# Patient Record
Sex: Male | Born: 1985 | Race: Black or African American | Hispanic: No | Marital: Single | Smoking: Current every day smoker
Health system: Southern US, Community
[De-identification: ages and names within clinical notes are randomized; demographics above are authoritative.]

## PROBLEM LIST (undated history)

## (undated) DIAGNOSIS — J45909 Unspecified asthma, uncomplicated: Secondary | ICD-10-CM

---

## 2009-04-26 ENCOUNTER — Emergency Department (HOSPITAL_COMMUNITY): Admission: EM | Admit: 2009-04-26 | Discharge: 2009-04-27 | Payer: Self-pay | Admitting: Emergency Medicine

## 2009-04-30 ENCOUNTER — Emergency Department: Payer: Self-pay | Admitting: Emergency Medicine

## 2010-08-26 IMAGING — CT CT HEAD WITHOUT CONTRAST
2 series · 16 of 30 positions shown, 20 images · non-contrast
Comparison: none

REASON FOR EXAM: headache
COMMENTS:

PROCEDURE:     CT  - CT HEAD WITHOUT CONTRAST  - April 30, 2009  [DATE]
RESULT:     No intra-axial or extra-axial pathologic fluid or blood
collections identified. No mass lesion noted. No hydrocephalus.

[Series 2: without · axial · non-contrast · 0.40mm/px · z∈[+522,+642]mm · 13 of 30 slices shown, 17 images]
[im 3/30  brain]
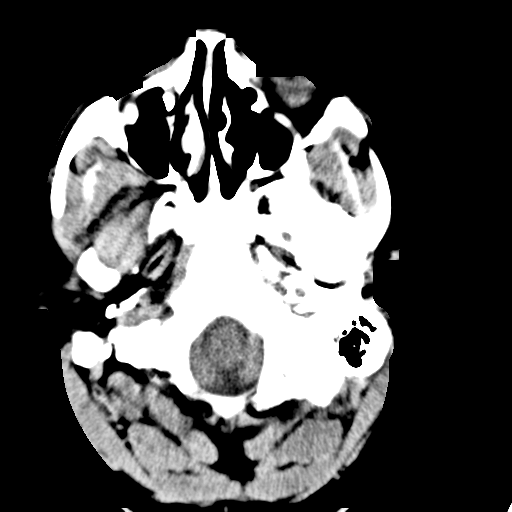
[im 3/30  bone]
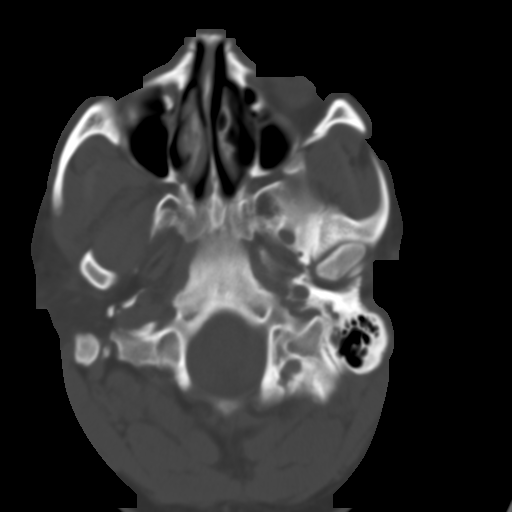
[im 5/30  brain]
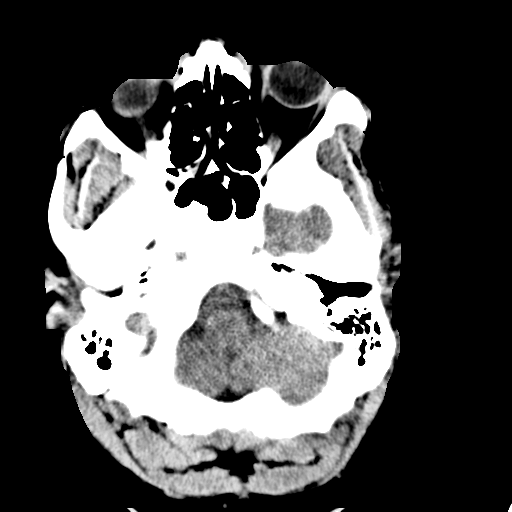
[im 7/30  brain]
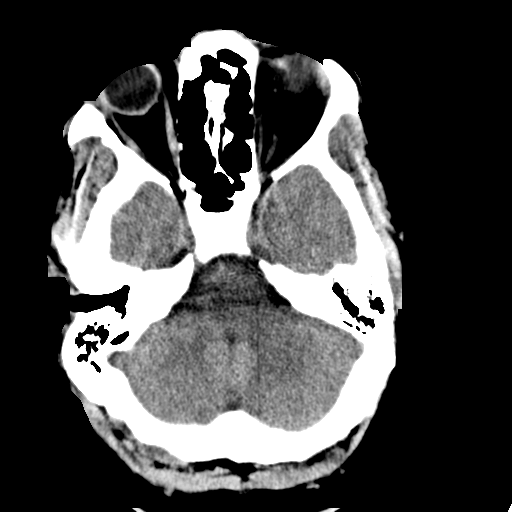
[im 9/30  brain]
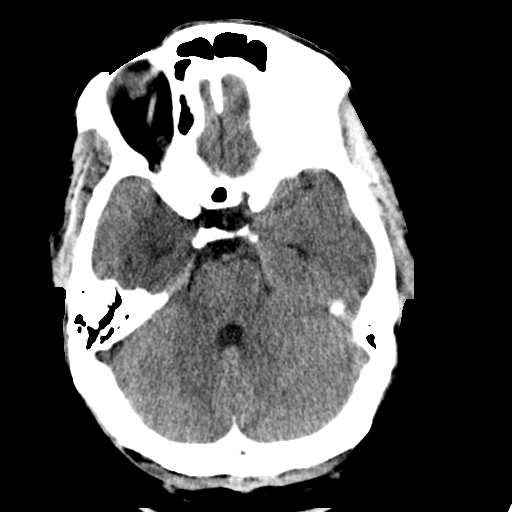
[im 11/30  brain]
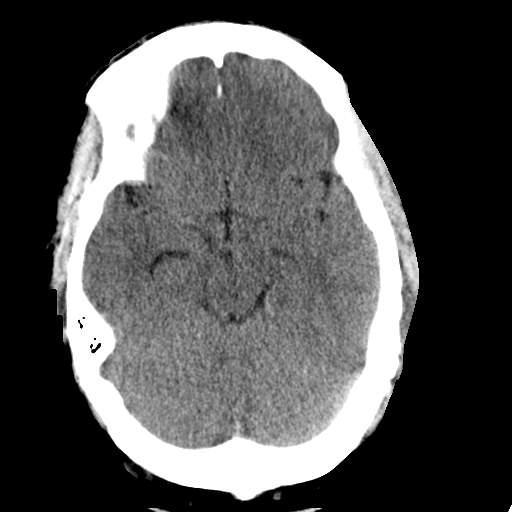
[im 11/30  bone]
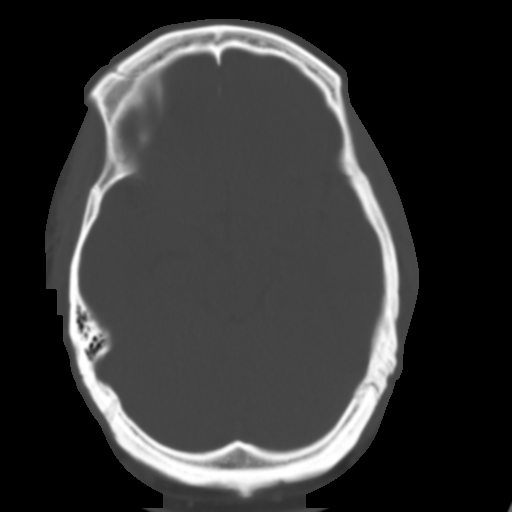
[im 13/30  brain]
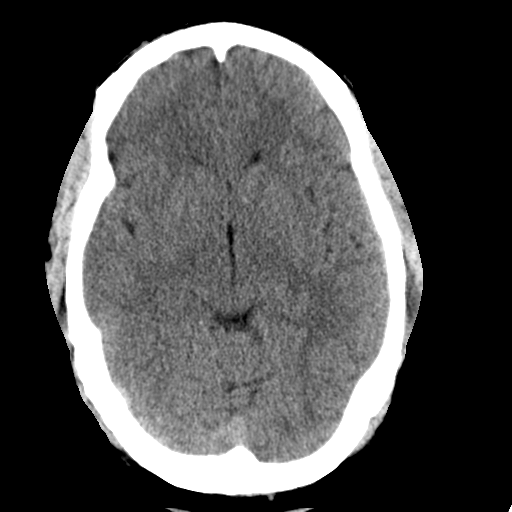
[im 15/30  brain]
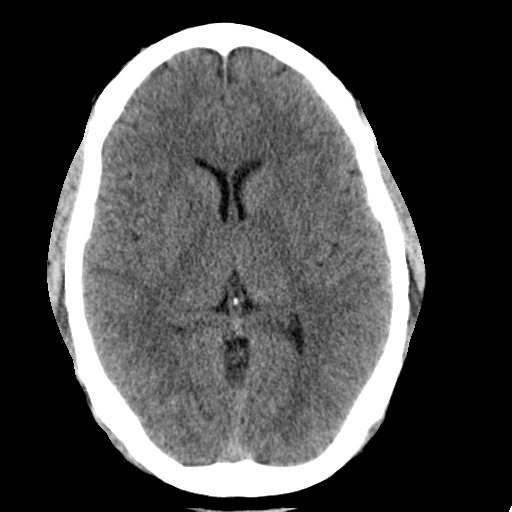
[im 17/30  brain]
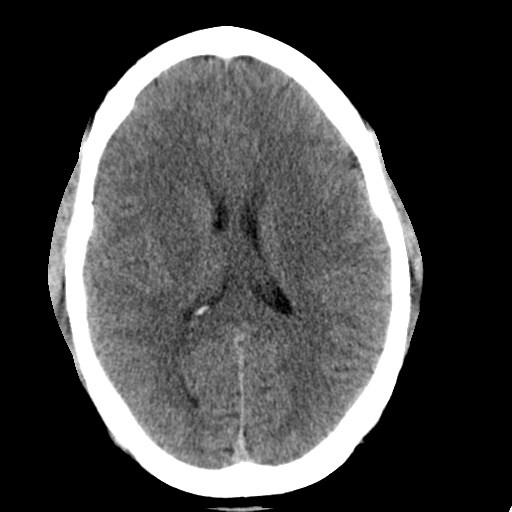
[im 19/30  brain]
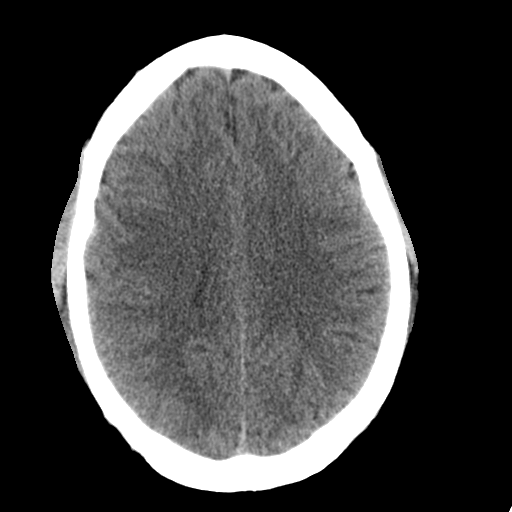
[im 19/30  bone]
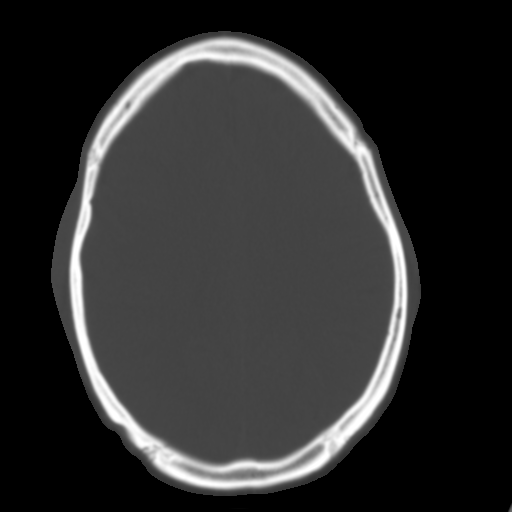
[im 21/30  brain]
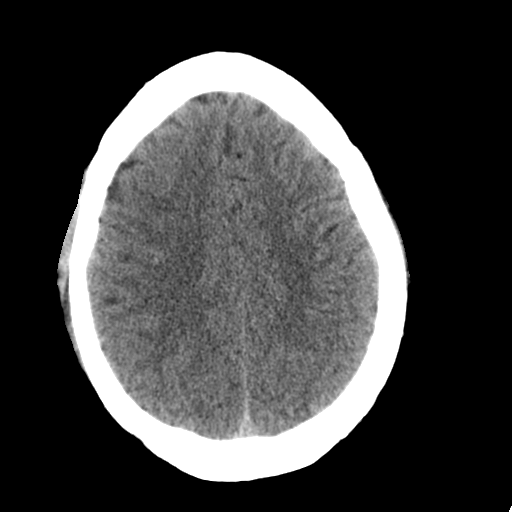
[im 23/30  brain]
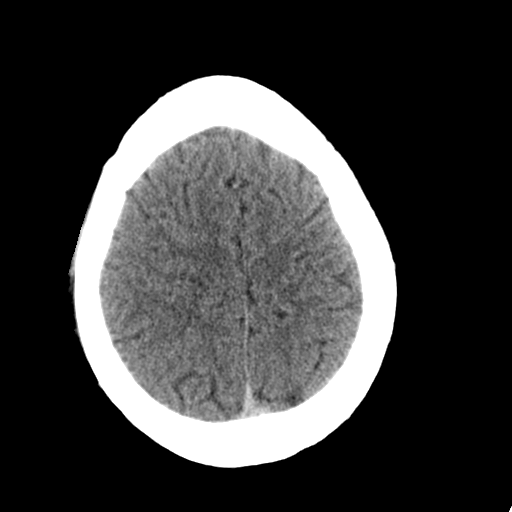
[im 25/30  brain]
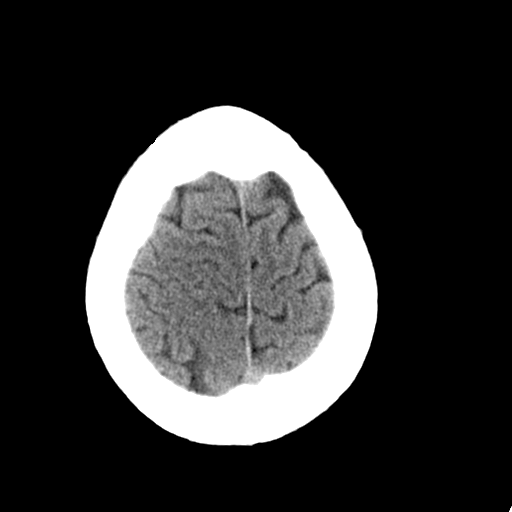
[im 27/30  brain]
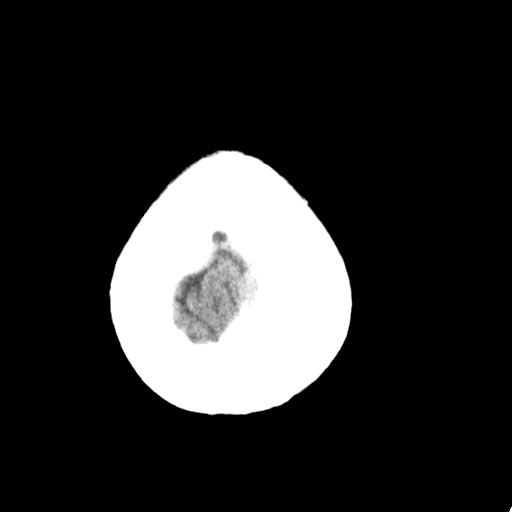
[im 27/30  bone]
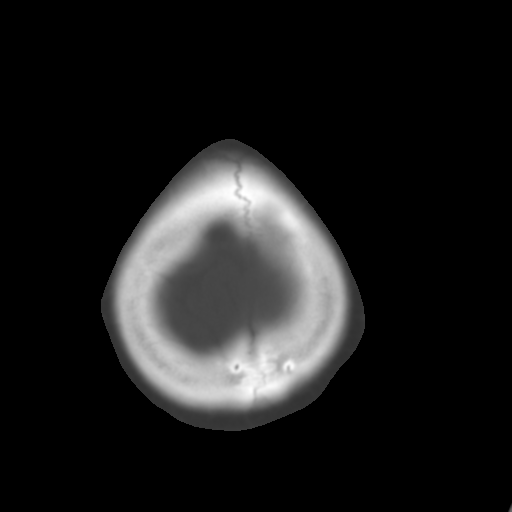

[Series 3: bone · axial · 0.40mm/px · z∈[+522,+562]mm · 3 of 30 slices shown]
[im 3/30  bone]
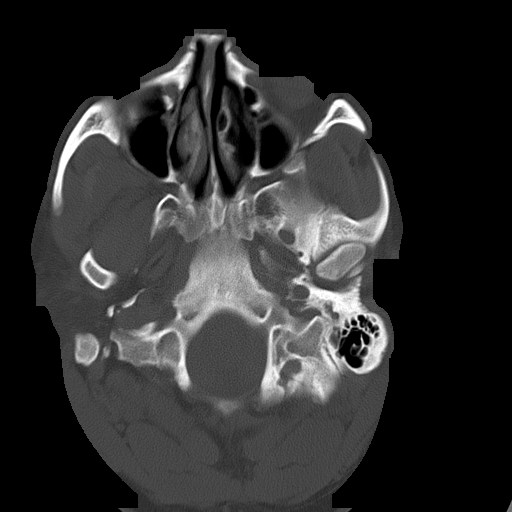
[im 7/30  bone]
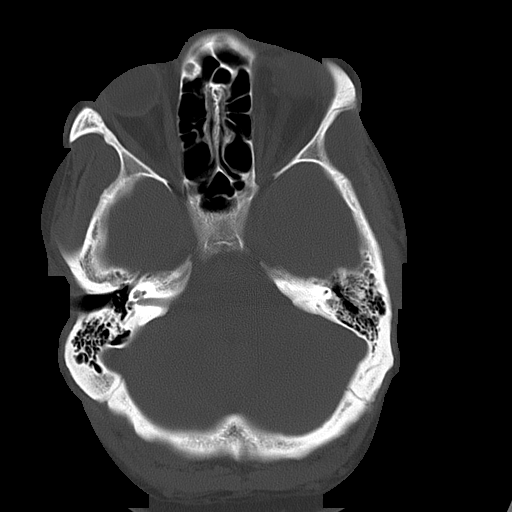
[im 11/30  bone]
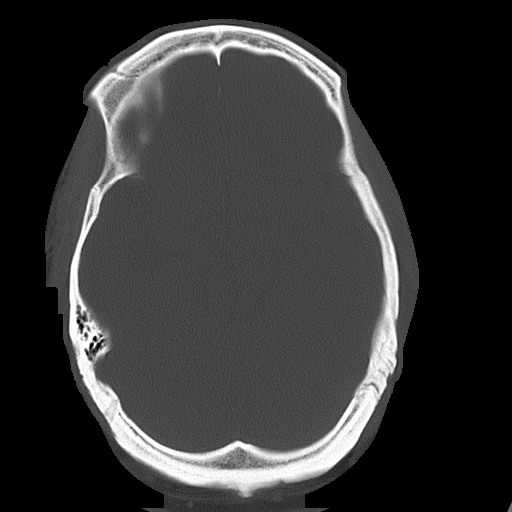

[16 of 30 positions shown; findings below may reference images not displayed]

IMPRESSION: Negative head CT.

## 2011-03-06 LAB — CBC
HCT: 37.8 % — ABNORMAL LOW (ref 39.0–52.0)
Hemoglobin: 13.3 g/dL (ref 13.0–17.0)
MCHC: 35.3 g/dL (ref 30.0–36.0)
MCV: 86.7 fL (ref 78.0–100.0)
Platelets: 289 10*3/uL (ref 150–400)
RDW: 12.8 % (ref 11.5–15.5)

## 2011-03-06 LAB — DIFFERENTIAL
Basophils Absolute: 0 10*3/uL (ref 0.0–0.1)
Eosinophils Absolute: 0.3 10*3/uL (ref 0.0–0.7)
Eosinophils Relative: 3 % (ref 0–5)
Monocytes Absolute: 0.9 10*3/uL (ref 0.1–1.0)

## 2011-03-06 LAB — BASIC METABOLIC PANEL
BUN: 8 mg/dL (ref 6–23)
CO2: 30 mEq/L (ref 19–32)
Chloride: 102 mEq/L (ref 96–112)
Glucose, Bld: 98 mg/dL (ref 70–99)
Potassium: 3.5 mEq/L (ref 3.5–5.1)
Sodium: 138 mEq/L (ref 135–145)

## 2016-01-19 ENCOUNTER — Emergency Department
Admission: EM | Admit: 2016-01-19 | Discharge: 2016-01-19 | Disposition: A | Discharge status: Home / Self Care | Payer: Commercial Managed Care - PPO | Attending: Emergency Medicine | Admitting: Emergency Medicine

## 2016-01-19 DIAGNOSIS — R5383 Other fatigue: Secondary | ICD-10-CM | POA: Insufficient documentation

## 2016-01-19 DIAGNOSIS — J0110 Acute frontal sinusitis, unspecified: Secondary | ICD-10-CM | POA: Diagnosis not present

## 2016-01-19 DIAGNOSIS — F17200 Nicotine dependence, unspecified, uncomplicated: Secondary | ICD-10-CM | POA: Insufficient documentation

## 2016-01-19 DIAGNOSIS — R51 Headache: Secondary | ICD-10-CM | POA: Diagnosis present

## 2016-01-19 LAB — RAPID INFLUENZA A&B ANTIGENS (ARMC ONLY)
INFLUENZA A (ARMC): NOT DETECTED
INFLUENZA B (ARMC): NOT DETECTED

## 2016-01-19 MED ORDER — AMOXICILLIN 875 MG PO TABS: 875.0000 mg | ORAL_TABLET | Freq: Two times a day (BID) | ORAL | Status: DC

## 2016-01-19 MED ORDER — PSEUDOEPH-BROMPHEN-DM 30-2-10 MG/5ML PO SYRP: 10.0000 mL | ORAL_SOLUTION | Freq: Four times a day (QID) | ORAL | Status: DC | PRN

## 2016-01-19 MED ORDER — FLUTICASONE PROPIONATE 50 MCG/ACT NA SUSP: 2.0000 | Freq: Every day | NASAL | Status: DC

## 2016-01-19 NOTE — ED Notes (Signed)
Cough, runny nose, congestion, headache X 2 days. Pt alert and oriented X4, active, cooperative, pt in NAD. RR even and unlabored, color WNL.

## 2016-01-19 NOTE — Discharge Instructions (Signed)

## 2016-01-19 NOTE — ED Provider Notes (Signed)
[facility] Emergency Department Provider Note  ____________________________________________  Time seen: Approximately 6:45 PM  I have reviewed the triage vital signs and the nursing notes.   HISTORY  Chief Complaint Nasal Congestion; URI; and Headache    HPI Tyrone Martin is a 30 y.o. male , NAD, presents to the emergency department with 2 day history of body aches, fever, nasal congestion, runny nose, cough. Had onset of sneezing with some frontal sinus pressure on Saturday. Yesterday had sudden onset of aches, fever, cough.Has been taking Tylenol and Mucinex as of yesterday. Last dose of Tylenol was earlier this morning. Denies any sick contacts. He shortness of breath, chest pain, back pain.   History reviewed. No pertinent past medical history.  There are no active problems to display for this patient.   History reviewed. No pertinent past surgical history.  Current Outpatient Rx  Name  Route  Sig  Dispense  Refill  . amoxicillin (AMOXIL) 875 MG tablet   Oral   Take 1 tablet (875 mg total) by mouth 2 (two) times daily.   20 tablet   0   . brompheniramine-pseudoephedrine-DM 30-2-10 MG/5ML syrup   Oral   Take 10 mLs by mouth 4 (four) times daily as needed.   200 mL   0   . fluticasone (FLONASE) 50 MCG/ACT nasal spray   Each Nare   Place 2 sprays into both nostrils daily.   16 g   0     Allergies Shellfish allergy  No family history on file.  Social History Social History  Substance Use Topics  . Smoking status: Current Every Day Smoker  . Smokeless tobacco: None  . Alcohol Use: Yes     Review of Systems  Constitutional: Positive fever, chills, fatigue Eyes: No visual changes. No discharge ENT: Positive sneezing, frontal sinus pressure, nasal congestion, runny nose. No sore throat. Cardiovascular: No chest pain. Respiratory: Positive cough. No shortness of breath. No wheezing.  Gastrointestinal: No abdominal pain.  No  nausea, vomiting.   Musculoskeletal: Positive for general myalgias. Skin: Negative for rash. Neurological: Positive for frontal sinus headaches, but no focal weakness or numbness. 10-point ROS otherwise negative.  ____________________________________________   PHYSICAL EXAM:  VITAL SIGNS: ED Triage Vitals  Enc Vitals Group     BP 01/19/16 1834 153/98 mmHg     Pulse Rate 01/19/16 1833 96     Resp 01/19/16 1833 18     Temp 01/19/16 1833 99.1 F (37.3 C)     Temp Source 01/19/16 1833 Oral     SpO2 01/19/16 1833 100 %     Weight 01/19/16 1833 250 lb (113.399 kg)     Height 01/19/16 1833  (1.727 m)     Head Cir --      Peak Flow --      Pain Score 01/19/16 1834 5     Pain Loc --      Pain Edu? --      Excl. in GC? --     Constitutional: Alert and oriented. Well appearing and in no acute distress. Eyes: Conjunctivae are normal. PERRL. EOMI without pain.  Head: Atraumatic. ENT:  Frontal sinus tenderness to percussion.       Ears: TMs visualized bilaterally with a dusky appearance, normal light reflex, mild serous effusion, mild bulging. No perforations noted.      Nose: Moderate congestion with profuse clear rhinnorhea.      Mouth/Throat: Mucous membranes are moist. Pharynx without erythema, swelling, exudates. Neck:  No stridor. No cervical spine tenderness to palpation. Supple with full range of motion. Hematological/Lymphatic/Immunilogical: No cervical lymphadenopathy. Cardiovascular: Normal rate, regular rhythm. Normal S1 and S2.  Good peripheral circulation. Respiratory: Normal respiratory effort without tachypnea or retractions. Lungs CTAB. Neurologic:  Normal speech and language. No gross focal neurologic deficits are appreciated.  Skin:  Skin is warm, dry and intact. No rash noted. Psychiatric: Mood and affect are normal. Speech and behavior are normal. Patient exhibits appropriate insight and judgement.   ____________________________________________   LABS (all  labs ordered are listed, but only abnormal results are displayed)  Labs Reviewed  RAPID INFLUENZA A&B ANTIGENS ([facility] ONLY)   ____________________________________________  EKG  None ____________________________________________  RADIOLOGY  none ____________________________________________    PROCEDURES  Procedure(s) performed: None    Medications - No data to display   ____________________________________________   INITIAL IMPRESSION / ASSESSMENT AND PLAN / ED COURSE  Pertinent lab results that were available during my care of the patient were reviewed by me and considered in my medical decision making (see chart for details).  Patient's diagnosis is consistent with acute frontal sinusitis. Patient will be discharged home with prescriptions for amoxicillin, Flonase, Bromfed DM to take as prescribed. Patient is to follow up with [facility] if symptoms persist past this treatment course. Patient is given ED precautions to return to the ED for any worsening or new symptoms.    ____________________________________________  FINAL CLINICAL IMPRESSION(S) / ED DIAGNOSES  Final diagnoses:  Acute frontal sinusitis, recurrence not specified      NEW MEDICATIONS STARTED DURING THIS VISIT:  New Prescriptions   AMOXICILLIN (AMOXIL) 875 MG TABLET    Take 1 tablet (875 mg total) by mouth 2 (two) times daily.   BROMPHENIRAMINE-PSEUDOEPHEDRINE-DM 30-2-10 MG/5ML SYRUP    Take 10 mLs by mouth 4 (four) times daily as needed.   FLUTICASONE (FLONASE) 50 MCG/ACT NASAL SPRAY    Place 2 sprays into both nostrils daily.         Hope Pigeon, PA-C 01/19/16 1935  Sharman Cheek, MD 01/21/16 [id number]

## 2016-02-07 ENCOUNTER — Encounter (HOSPITAL_COMMUNITY): Payer: Self-pay | Admitting: Emergency Medicine

## 2016-02-07 ENCOUNTER — Emergency Department (HOSPITAL_COMMUNITY)
Admission: EM | Admit: 2016-02-07 | Discharge: 2016-02-07 | Disposition: A | Discharge status: Home / Self Care | Payer: Commercial Managed Care - PPO | Attending: Emergency Medicine | Admitting: Emergency Medicine

## 2016-02-07 DIAGNOSIS — J45909 Unspecified asthma, uncomplicated: Secondary | ICD-10-CM | POA: Diagnosis not present

## 2016-02-07 DIAGNOSIS — Y93F2 Activity, caregiving, lifting: Secondary | ICD-10-CM | POA: Insufficient documentation

## 2016-02-07 DIAGNOSIS — S39012A Strain of muscle, fascia and tendon of lower back, initial encounter: Secondary | ICD-10-CM | POA: Diagnosis not present

## 2016-02-07 DIAGNOSIS — Y999 Unspecified external cause status: Secondary | ICD-10-CM | POA: Insufficient documentation

## 2016-02-07 DIAGNOSIS — Y929 Unspecified place or not applicable: Secondary | ICD-10-CM | POA: Insufficient documentation

## 2016-02-07 DIAGNOSIS — F17200 Nicotine dependence, unspecified, uncomplicated: Secondary | ICD-10-CM | POA: Insufficient documentation

## 2016-02-07 DIAGNOSIS — M545 Low back pain: Secondary | ICD-10-CM | POA: Diagnosis present

## 2016-02-07 DIAGNOSIS — Z792 Long term (current) use of antibiotics: Secondary | ICD-10-CM | POA: Diagnosis not present

## 2016-02-07 DIAGNOSIS — W240XXA Contact with lifting devices, not elsewhere classified, initial encounter: Secondary | ICD-10-CM | POA: Diagnosis not present

## 2016-02-07 HISTORY — DX: Unspecified asthma, uncomplicated: J45.909

## 2016-02-07 MED ORDER — DEXAMETHASONE 4 MG PO TABS
4.0000 mg | ORAL_TABLET | Freq: Two times a day (BID) | ORAL | Status: DC
Start: 2016-02-07 — End: 2024-03-19

## 2016-02-07 MED ORDER — DICLOFENAC SODIUM 75 MG PO TBEC
75.0000 mg | DELAYED_RELEASE_TABLET | Freq: Two times a day (BID) | ORAL | Status: DC
Start: 2016-02-07 — End: 2024-03-19

## 2016-02-07 MED ORDER — METHOCARBAMOL 500 MG PO TABS
500.0000 mg | ORAL_TABLET | Freq: Three times a day (TID) | ORAL | Status: DC
Start: 2016-02-07 — End: 2024-03-19

## 2016-02-07 NOTE — Discharge Instructions (Signed)
Please use a heating pad to your lower back, or warm tub soaks for 15-20 minutes daily. Please use Decadron and diclofenac 2 times daily with food until all taken. Use Robaxin 3 times daily. This medication may cause drowsiness, please do not drive, operate machinery, drink alcohol, or pertussis and activities requiring concentration when taking this medication. Please see Dr. Eulah PontMurphy for evaluation if not improving. Muscle Strain A muscle strain is an injury that occurs when a muscle is stretched beyond its normal length. Usually a small number of muscle fibers are torn when this happens. Muscle strain is rated in degrees. First-degree strains have the least amount of muscle fiber tearing and pain. Second-degree and third-degree strains have increasingly more tearing and pain.  Usually, recovery from muscle strain takes 1-2 weeks. Complete healing takes 5-6 weeks.  CAUSES  Muscle strain happens when a sudden, violent force placed on a muscle stretches it too far. This may occur with lifting, sports, or a fall.  RISK FACTORS Muscle strain is especially common in athletes.  SIGNS AND SYMPTOMS At the site of the muscle strain, there may be:  Pain.  Bruising.  Swelling.  Difficulty using the muscle due to pain or lack of normal function. DIAGNOSIS  Your health care provider will perform a physical exam and ask about your medical history. TREATMENT  Often, the best treatment for a muscle strain is resting, icing, and applying cold compresses to the injured area.  HOME CARE INSTRUCTIONS   Use the PRICE method of treatment to promote muscle healing during the first 2-3 days after your injury. The PRICE method involves:  Protecting the muscle from being injured again.  Restricting your activity and resting the injured body part.  Icing your injury. To do this, put ice in a plastic bag. Place a towel between your skin and the bag. Then, apply the ice and leave it on from 15-20 minutes each  hour. After the third day, switch to moist heat packs.  Apply compression to the injured area with a splint or elastic bandage. Be careful not to wrap it too tightly. This may interfere with blood circulation or increase swelling.  Elevate the injured body part above the level of your heart as often as you can.  Only take over-the-counter or prescription medicines for pain, discomfort, or fever as directed by your health care provider.  Warming up prior to exercise helps to prevent future muscle strains. SEEK MEDICAL CARE IF:   You have increasing pain or swelling in the injured area.  You have numbness, tingling, or a significant loss of strength in the injured area. MAKE SURE YOU:   Understand these instructions.  Will watch your condition.  Will get help right away if you are not doing well or get worse.   This information is not intended to replace advice given to you by your health care provider. Make sure you discuss any questions you have with your health care provider.   Document Released: 11/13/2005 Document Revised: 09/03/2013 Document Reviewed: 06/12/2013 Elsevier Interactive Patient Education [employer]2016 [employer] Inc.

## 2016-02-07 NOTE — ED Notes (Signed)
Pt c/o lower back pain x 2 weeks. Pt states pain began after working on building a fence.

## 2016-02-07 NOTE — ED Provider Notes (Signed)
CSN: [id number]     Arrival date & time 02/07/16  1553 History   First MD Initiated Contact with Patient 02/07/16 1639     Chief Complaint  Patient presents with  . Back Pain     (Consider location/radiation/quality/duration/timing/severity/associated sxs/prior Treatment) Patient is a 30 y.o. male presenting with back pain. The history is provided by the patient.  Back Pain Location:  Lumbar spine Quality:  Aching Radiates to:  Does not radiate Pain severity:  Moderate Pain is:  Same all the time Onset quality:  Gradual Duration:  2 weeks Timing:  Intermittent Progression:  Worsening Chronicity:  New Context: lifting heavy objects   Relieved by:  Nothing Worsened by:  Lying down Ineffective treatments:  OTC medications Associated symptoms: no bladder incontinence, no bowel incontinence and no perianal numbness   Risk factors: no recent surgery     Past Medical History  Diagnosis Date  . Asthma    History reviewed. No pertinent past surgical history. No family history on file. Social History  Substance Use Topics  . Smoking status: Current Every Day Smoker  . Smokeless tobacco: None  . Alcohol Use: Yes    Review of Systems  Gastrointestinal: Negative for bowel incontinence.  Genitourinary: Negative for bladder incontinence.  Musculoskeletal: Positive for back pain.  All other systems reviewed and are negative.     Allergies  Shellfish allergy  Home Medications   Prior to Admission medications   Medication Sig Start Date End Date Taking? Authorizing Provider  amoxicillin (AMOXIL) 875 MG tablet Take 1 tablet (875 mg total) by mouth 2 (two) times daily. 01/19/16   Jami L Hagler, PA-C  brompheniramine-pseudoephedrine-DM 30-2-10 MG/5ML syrup Take 10 mLs by mouth 4 (four) times daily as needed. 01/19/16   Jami L Hagler, PA-C  fluticasone (FLONASE) 50 MCG/ACT nasal spray Place 2 sprays into both nostrils daily. 01/19/16   Jami L Hagler, PA-C   BP 123/78 mmHg   Pulse 82  Temp(Src) 98.7 F (37.1 C) (Oral)  Resp 18  Ht  (1.727 m)  Wt 111.131 kg  BMI 37.26 kg/m2  SpO2 100% Physical Exam  Constitutional: He is oriented to person, place, and time. He appears well-developed and well-nourished.  Non-toxic appearance.  HENT:  Head: Normocephalic.  Right Ear: Tympanic membrane and external ear normal.  Left Ear: Tympanic membrane and external ear normal.  Eyes: EOM and lids are normal. Pupils are equal, round, and reactive to light.  Neck: Normal range of motion. Neck supple. Carotid bruit is not present.  Cardiovascular: Normal rate, regular rhythm, normal heart sounds, intact distal pulses and normal pulses.   Pulmonary/Chest: Breath sounds normal. No respiratory distress.  Abdominal: Soft. Bowel sounds are normal. There is no tenderness. There is no guarding.  Musculoskeletal: Normal range of motion.       Lumbar back: He exhibits tenderness and spasm. He exhibits no deformity.       Back:  Lymphadenopathy:       Head (right side): No submandibular adenopathy present.       Head (left side): No submandibular adenopathy present.    He has no cervical adenopathy.  Neurological: He is alert and oriented to person, place, and time. He has normal strength. No cranial nerve deficit or sensory deficit.  Skin: Skin is warm and dry.  Psychiatric: He has a normal mood and affect. His speech is normal.  Nursing note and vitals reviewed.   ED Course  Procedures (including critical care time) Labs Review  Labs Reviewed - No data to display  Imaging Review No results found. I have personally reviewed and evaluated these images and lab results as part of my medical decision-making.   EKG Interpretation None      MDM  There no gross neurologic deficits appreciated on examination. No evidence for caudal equina. Suspect the patient has a muscle strain in the lumbar area. The patient is asked to rest his back, to use a heating pad, or warm tub  soaks. Prescription for Robaxin, diclofenac, and Decadron given to the patient. The patient is referred to Dr. Eulah PontMurphy for additional evaluation if not improving. Patient is in agreement with this discharge plan.    Final diagnoses:  Lumbar strain, initial encounter    *I have reviewed nursing notes, vital signs, and all appropriate lab and imaging results for this patient.Ivery Quale**    Nathanal Hermiz, PA-C 02/07/16 1658  Samuel JesterKathleen McManus, DO 02/11/16 1737

## 2016-02-07 NOTE — ED Notes (Signed)
Pt verbalized understanding of no driving and to use caution within 4 hours of taking pain meds due to meds cause drowsiness 

## 2018-04-22 ENCOUNTER — Emergency Department (HOSPITAL_COMMUNITY)
Admission: EM | Admit: 2018-04-22 | Discharge: 2018-04-22 | Disposition: A | Discharge status: Home / Self Care | Payer: Self-pay | Attending: Emergency Medicine | Admitting: Emergency Medicine

## 2018-04-22 ENCOUNTER — Other Ambulatory Visit: Payer: Self-pay

## 2018-04-22 ENCOUNTER — Encounter (HOSPITAL_COMMUNITY): Payer: Self-pay | Admitting: *Deleted

## 2018-04-22 ENCOUNTER — Emergency Department (HOSPITAL_COMMUNITY): Payer: Self-pay

## 2018-04-22 DIAGNOSIS — J45909 Unspecified asthma, uncomplicated: Secondary | ICD-10-CM | POA: Insufficient documentation

## 2018-04-22 DIAGNOSIS — R0789 Other chest pain: Secondary | ICD-10-CM | POA: Insufficient documentation

## 2018-04-22 DIAGNOSIS — Z79899 Other long term (current) drug therapy: Secondary | ICD-10-CM | POA: Insufficient documentation

## 2018-04-22 LAB — COMPREHENSIVE METABOLIC PANEL
ALBUMIN: 4 g/dL (ref 3.5–5.0)
ALT: 21 U/L (ref 17–63)
ANION GAP: 8 (ref 5–15)
AST: 19 U/L (ref 15–41)
Alkaline Phosphatase: 47 U/L (ref 38–126)
BILIRUBIN TOTAL: 0.5 mg/dL (ref 0.3–1.2)
BUN: 10 mg/dL (ref 6–20)
CO2: 27 mmol/L (ref 22–32)
Calcium: 9.3 mg/dL (ref 8.9–10.3)
Chloride: 103 mmol/L (ref 101–111)
Creatinine, Ser: 0.85 mg/dL (ref 0.61–1.24)
GFR calc Af Amer: >60 mL/min (ref >60)
Glucose, Bld: 103 mg/dL — ABNORMAL HIGH (ref 65–99)
POTASSIUM: 3.8 mmol/L (ref 3.5–5.1)
Sodium: 138 mmol/L (ref 135–145)
TOTAL PROTEIN: 7.4 g/dL (ref 6.5–8.1)

## 2018-04-22 LAB — I-STAT TROPONIN, ED: Troponin i, poc: 0 ng/mL (ref 0.00–0.08)

## 2018-04-22 LAB — CBC
HCT: 40.4 % (ref 39.0–52.0)
Hemoglobin: 13.6 g/dL (ref 13.0–17.0)
MCH: 29.6 pg (ref 26.0–34.0)
MCHC: 33.7 g/dL (ref 30.0–36.0)
MCV: 87.8 fL (ref 78.0–100.0)
PLATELETS: 253 10*3/uL (ref 150–400)
RBC: 4.6 MIL/uL (ref 4.22–5.81)
RDW: 12.8 % (ref 11.5–15.5)
WBC: 8.5 10*3/uL (ref 4.0–10.5)

## 2018-04-22 LAB — D-DIMER, QUANTITATIVE: D-Dimer, Quant: <0.27 ug/mL-FEU (ref 0.00–0.50)

## 2018-04-22 LAB — LIPASE, BLOOD: Lipase: 27 U/L (ref 11–51)

## 2018-04-22 LAB — CK: CK TOTAL: 284 U/L (ref 49–397)

## 2018-04-22 MED ORDER — SODIUM CHLORIDE 0.9 % IV BOLUS
500.0000 mL | Freq: Once | INTRAVENOUS | Status: AC
  Administered 2018-04-22: 500 mL via INTRAVENOUS

## 2018-04-22 MED ORDER — SODIUM CHLORIDE 0.9 % IV BOLUS
1000.0000 mL | Freq: Once | INTRAVENOUS | Status: AC
  Administered 2018-04-22: 1000 mL via INTRAVENOUS

## 2018-04-22 MED ORDER — KETOROLAC TROMETHAMINE 30 MG/ML IJ SOLN
30.0000 mg | Freq: Once | INTRAMUSCULAR | Status: AC
  Administered 2018-04-22: 30 mg via INTRAVENOUS
  Filled 2018-04-22: qty 1

## 2018-04-22 NOTE — ED Triage Notes (Signed)
Pt c/o central chest pain that radiates around to his back that started x one day ago; pt states he was driving when the pain occurred; pt states the pain is worse when he takes a deep breath or eats; pt denies and n/v

## 2018-04-22 NOTE — ED Provider Notes (Signed)
[facility] EMERGENCY DEPARTMENT Provider Note   CSN: [id number] Arrival date & time: 04/22/18  6213  Time seen 04:10 AM   History   Chief Complaint Chief Complaint  Patient presents with  . Chest Pain    HPI Tyrone Martin is a 32 y.o. male.  HPI patient reports Saturday, May 25 he was a passenger in a car and he started getting lower central chest achiness that has been there constantly.  He states the pain radiates into his back.  He feels some shortness of breath but he denies cough, sore throat, rhinorrhea, or sneezing.  He states 10 minutes before I saw him he started getting nausea without vomiting.  He denies any burning sensation or burning fluid in his throat.  He states that moving his right arm, taking a big deep breath, or swallowing makes the pain worse.  He denies feeling like the food is getting stuck.  He states nothing makes it feel better.  He states he works out in the [employer] and it is been very hot for the last few days in the 90s.  He reports sweating a lot.  He states he has been drinking some sports drinks the last couple days while working outside.  He denies any family history of coronary artery disease.  PCP Patient, No Pcp Per   Past Medical History:  Diagnosis Date  . Asthma     There are no active problems to display for this patient.   History reviewed. No pertinent surgical history.      Home Medications    Prior to Admission medications   Medication Sig Start Date End Date Taking? Authorizing Provider  amoxicillin (AMOXIL) 875 MG tablet Take 1 tablet (875 mg total) by mouth 2 (two) times daily. 01/19/16   Hagler, Jami L, PA-C  brompheniramine-pseudoephedrine-DM 30-2-10 MG/5ML syrup Take 10 mLs by mouth 4 (four) times daily as needed. 01/19/16   Hagler, Jami L, PA-C  dexamethasone (DECADRON) 4 MG tablet Take 1 tablet (4 mg total) by mouth 2 (two) times daily with a meal. 02/07/16   Ivery Quale, PA-C  diclofenac (VOLTAREN) 75  MG EC tablet Take 1 tablet (75 mg total) by mouth 2 (two) times daily. 02/07/16   Ivery Quale, PA-C  fluticasone (FLONASE) 50 MCG/ACT nasal spray Place 2 sprays into both nostrils daily. 01/19/16   Hagler, Jami L, PA-C  methocarbamol (ROBAXIN) 500 MG tablet Take 1 tablet (500 mg total) by mouth 3 (three) times daily. 02/07/16   Ivery Quale, PA-C    Family History History reviewed. No pertinent family history.  Social History Social History   Tobacco Use  . Smoking status: Current Every Day Smoker    Packs/day: 0.10    Types: Cigarettes  . Smokeless tobacco: Never Used  Substance Use Topics  . Alcohol use: Yes    Comment: occasionally  . Drug use: No  employed   Allergies   Shellfish allergy   Review of Systems Review of Systems  All other systems reviewed and are negative.    Physical Exam Updated Vital Signs BP (!) 152/89 (BP Location: Right Arm) Comment: Simultaneous filing. User may not have seen previous data.  Pulse 88   Temp 98.1 F (36.7 C) (Oral)   Resp (!) 21   Ht  (1.702 m)   Wt 115.7 kg (255 lb)   SpO2 99%   BMI 39.94 kg/m   Physical Exam  Constitutional: He is oriented to person, place, and time.  He appears well-developed and well-nourished.  Non-toxic appearance. He does not appear ill. No distress.  HENT:  Head: Normocephalic and atraumatic.  Right Ear: External ear normal.  Left Ear: External ear normal.  Nose: Nose normal. No mucosal edema or rhinorrhea.  Mouth/Throat: Oropharynx is clear and moist and mucous membranes are normal. No dental abscesses or uvula swelling.  Eyes: Pupils are equal, round, and reactive to light. Conjunctivae and EOM are normal.  Neck: Normal range of motion and full passive range of motion without pain. Neck supple.  Cardiovascular: Normal rate, regular rhythm and normal heart sounds. Exam reveals no gallop and no friction rub.  No murmur heard. Pulmonary/Chest: Effort normal and breath sounds normal. No  respiratory distress. He has no wheezes. He has no rhonchi. He has no rales. He exhibits no tenderness and no crepitus.  Area of pain noted    Abdominal: Soft. Normal appearance and bowel sounds are normal. He exhibits no distension. There is no tenderness. There is no rebound and no guarding.  Musculoskeletal: Normal range of motion. He exhibits no edema or tenderness.  Moves all extremities well.   Neurological: He is alert and oriented to person, place, and time. He has normal strength. No cranial nerve deficit.  Skin: Skin is warm, dry and intact. No rash noted. No erythema. No pallor.  Psychiatric: He has a normal mood and affect. His speech is normal and behavior is normal. His mood appears not anxious.  Nursing note and vitals reviewed.    ED Treatments / Results  Labs (all labs ordered are listed, but only abnormal results are displayed) Results for orders placed or performed during the hospital encounter of 04/22/18  CBC  Result Value Ref Range   WBC 8.5 4.0 - 10.5 K/uL   RBC 4.60 4.22 - 5.81 MIL/uL   Hemoglobin 13.6 13.0 - 17.0 g/dL   HCT 16.1 09.6 - 04.5 %   MCV 87.8 78.0 - 100.0 fL   MCH 29.6 26.0 - 34.0 pg   MCHC 33.7 30.0 - 36.0 g/dL   RDW 40.9 81.1 - 91.4 %   Platelets 253 150 - 400 K/uL  Comprehensive metabolic panel  Result Value Ref Range   Sodium 138 135 - 145 mmol/L   Potassium 3.8 3.5 - 5.1 mmol/L   Chloride 103 101 - 111 mmol/L   CO2 27 22 - 32 mmol/L   Glucose, Bld 103 (H) 65 - 99 mg/dL   BUN 10 6 - 20 mg/dL   Creatinine, Ser 7.82 0.61 - 1.24 mg/dL   Calcium 9.3 8.9 - 95.6 mg/dL   Total Protein 7.4 6.5 - 8.1 g/dL   Albumin 4.0 3.5 - 5.0 g/dL   AST 19 15 - 41 U/L   ALT 21 17 - 63 U/L   Alkaline Phosphatase 47 38 - 126 U/L   Total Bilirubin 0.5 0.3 - 1.2 mg/dL   GFR calc non Af Amer >60 >60 mL/min   GFR calc Af Amer >60 >60 mL/min   Anion gap 8 5 - 15  D-dimer, quantitative  Result Value Ref Range   D-Dimer, Quant <0.27 0.00 - 0.50 ug/mL-FEU    Lipase, blood  Result Value Ref Range   Lipase 27 11 - 51 U/L  CK  Result Value Ref Range   Total CK 284 49 - 397 U/L  I-stat troponin, ED  Result Value Ref Range   Troponin i, poc 0.00 0.00 - 0.08 ng/mL   Comment 3  Laboratory interpretation all normal     EKG EKG Interpretation  Date/Time:  Monday Apr 22 2018 03:30:15 EDT Ventricular Rate:  94 PR Interval:    QRS Duration: 105 QT Interval:  333 QTC Calculation: 417 R Axis:   29 Text Interpretation:  Sinus rhythm Probable left atrial enlargement Borderline T wave abnormalities ST elev, probable normal early repol pattern Electrode noise No old tracing to compare Confirmed by Devoria Albe ([id number]) on 04/22/2018 3:54:59 AM   Radiology Dg Chest 2 View  Result Date: 04/22/2018 CLINICAL DATA:  Central chest pain radiating to the back for 1 day. EXAM: CHEST - 2 VIEW COMPARISON:  None. FINDINGS: The heart size and mediastinal contours are within normal limits. Both lungs are clear. The visualized skeletal structures are unremarkable. IMPRESSION: No active cardiopulmonary disease. Electronically Signed   By: Burman Nieves M.D.   On: 04/22/2018 04:42    Procedures Procedures (including critical care time)  Medications Ordered in ED Medications  ketorolac (TORADOL) 30 MG/ML injection 30 mg (30 mg Intravenous Given 04/22/18 0424)  sodium chloride 0.9 % bolus 1,000 mL (0 mLs Intravenous Stopped 04/22/18 0552)  sodium chloride 0.9 % bolus 500 mL (0 mLs Intravenous Stopped 04/22/18 0552)     Initial Impression / Assessment and Plan / ED Course  I have reviewed the triage vital signs and the nursing notes.  Pertinent labs & imaging results that were available during my care of the patient were reviewed by me and considered in my medical decision making (see chart for details).    Patient presents with some atypical chest pain.  Has some musculoskeletal component to it although is not tender to palpation.  Laboratory  testing was done including a troponin which was 0.  He states the pain is been there constantly since yesterday so it would definitely be abnormal if he is having myocardial ischemia.  He has been working out in the heat so a CK was ordered and a d-dimer was also added.  Patient was given IV fluids and IV Toradol for his pain while waiting for test results.  Recheck at 5:45 AM patient states his pain is better.  We discussed his test results which were all normal.  I feel his chest pain is probably musculoskeletal in origin.  He can take ibuprofen for pain.   Final Clinical Impressions(s) / ED Diagnoses   Final diagnoses:  Chest wall pain    ED Discharge Orders    None    OTC ibuprofen   Plan discharge  Devoria Albe, MD, Concha Pyo, MD 04/22/18 [phone]

## 2018-04-22 NOTE — Discharge Instructions (Addendum)
Take ibuprofen 600 mg 4 times a day for pain. Use heat and ice for comfort. Recheck if you get worse.

## 2019-08-18 IMAGING — DX DG CHEST 2V
2 series · 2 of 2 positions shown · non-contrast
Comparison: None.

CLINICAL DATA: Central chest pain radiating to the back for 1 day.

EXAM:
CHEST - 2 VIEW

[chest pa]
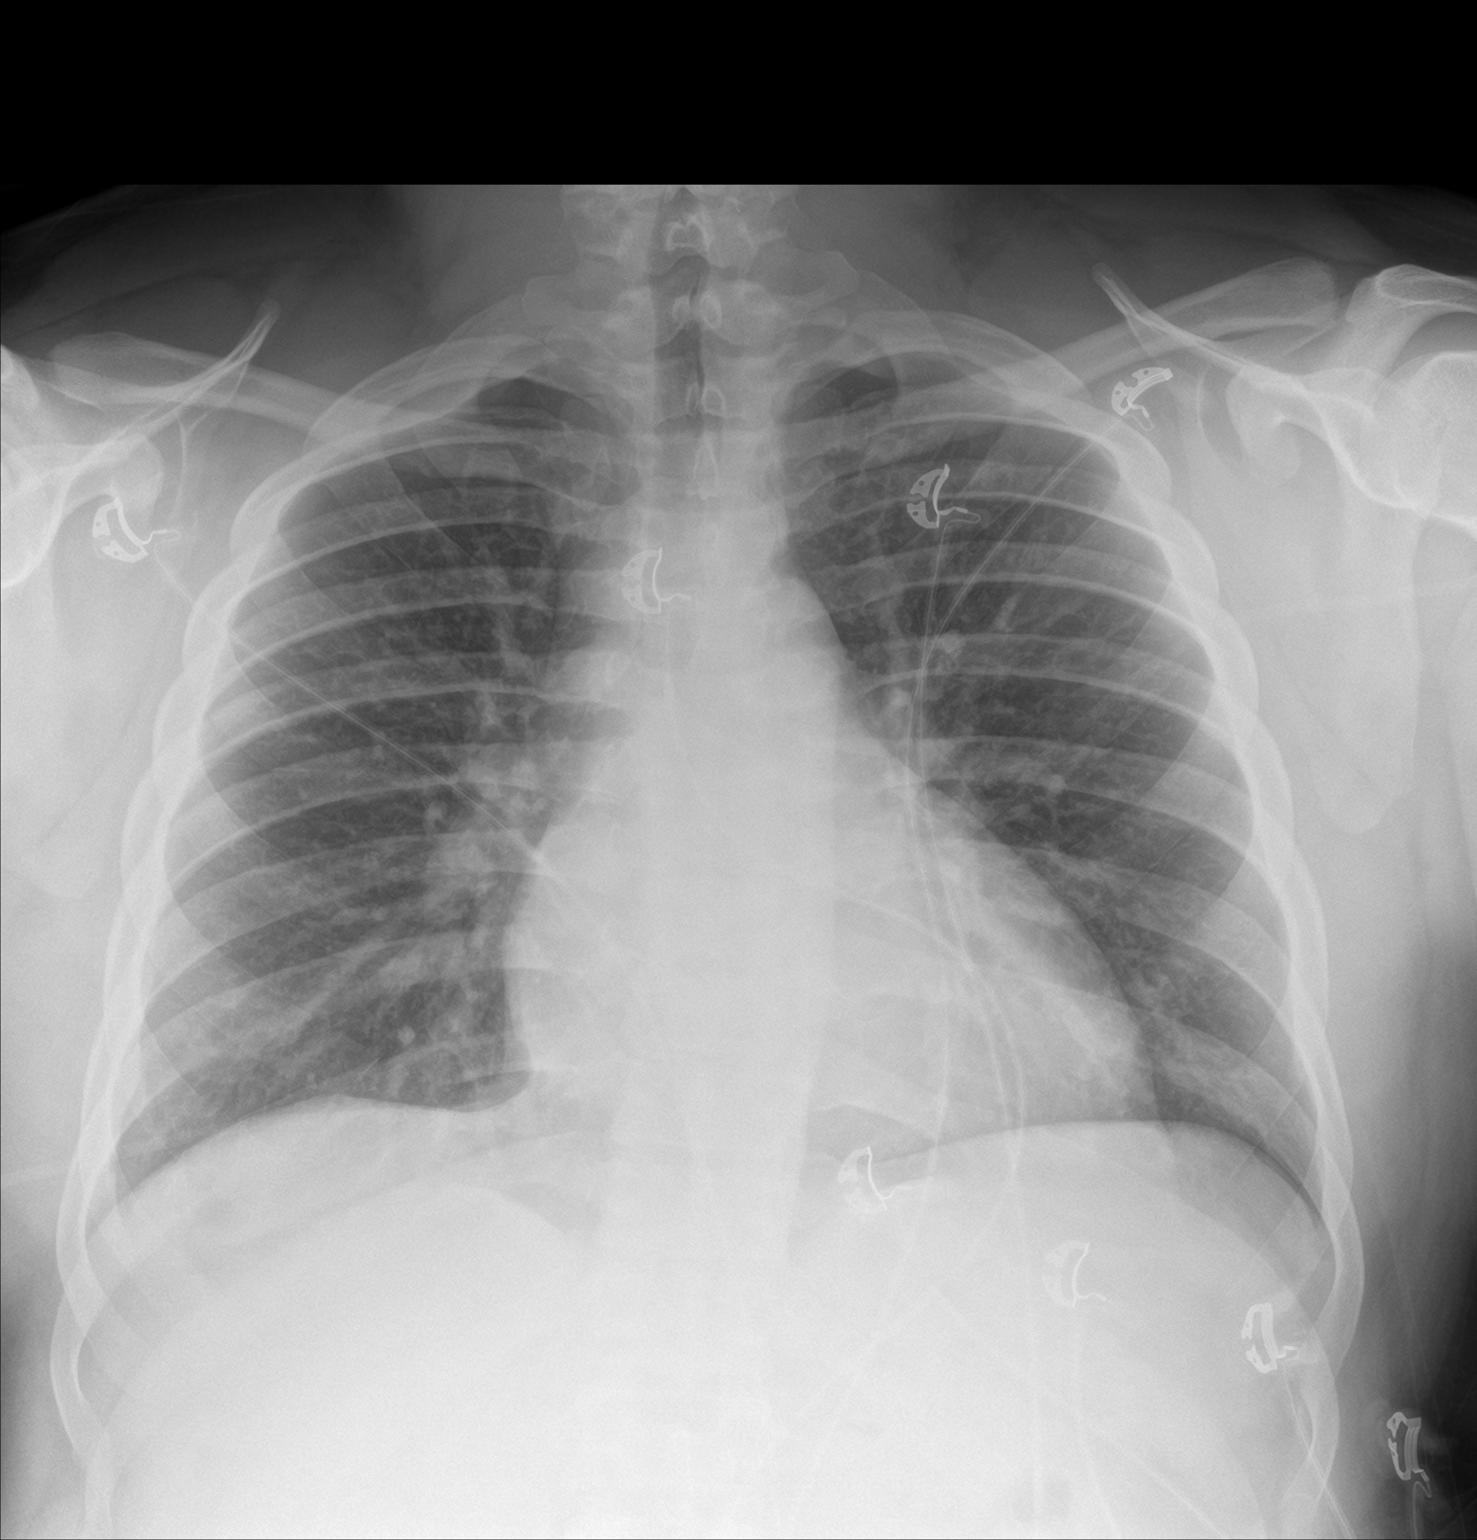

[chest lat]
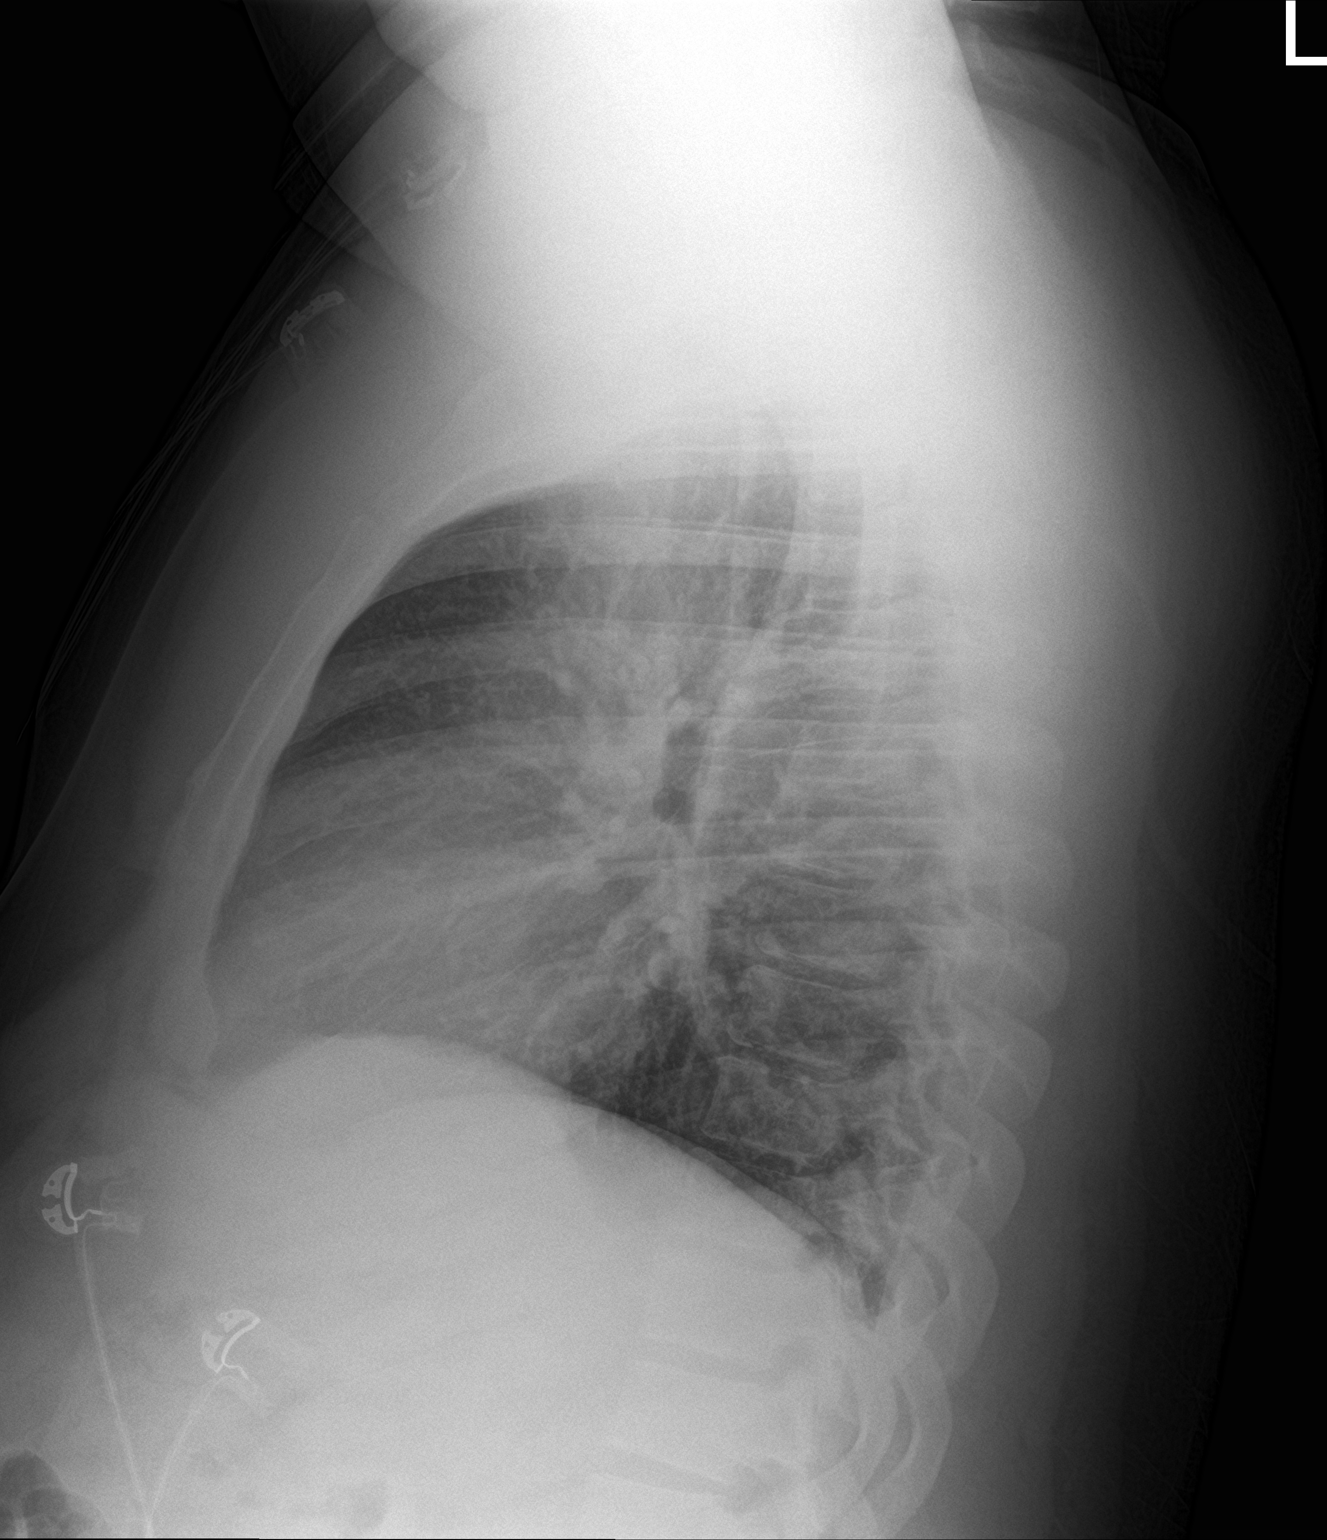

[2 of 2 positions shown; findings below may reference images not displayed]

FINDINGS: The heart size and mediastinal contours are within normal limits.
Both lungs are clear. The visualized skeletal structures are
unremarkable.
IMPRESSION: No active cardiopulmonary disease.

## 2024-03-19 ENCOUNTER — Other Ambulatory Visit: Payer: Self-pay

## 2024-03-19 ENCOUNTER — Inpatient Hospital Stay (HOSPITAL_COMMUNITY)
Admission: EM | Admit: 2024-03-19 | Discharge: 2024-03-21 | DRG: 153 | Disposition: A | Discharge status: Home / Self Care | Attending: Internal Medicine | Admitting: Internal Medicine

## 2024-03-19 ENCOUNTER — Emergency Department (HOSPITAL_COMMUNITY)

## 2024-03-19 ENCOUNTER — Encounter (HOSPITAL_COMMUNITY): Payer: Self-pay | Admitting: Emergency Medicine

## 2024-03-19 DIAGNOSIS — R590 Localized enlarged lymph nodes: Secondary | ICD-10-CM | POA: Diagnosis present

## 2024-03-19 DIAGNOSIS — J0510 Acute epiglottitis without obstruction: Secondary | ICD-10-CM | POA: Diagnosis present

## 2024-03-19 DIAGNOSIS — R1310 Dysphagia, unspecified: Secondary | ICD-10-CM | POA: Diagnosis present

## 2024-03-19 DIAGNOSIS — Z91013 Allergy to seafood: Secondary | ICD-10-CM | POA: Diagnosis not present

## 2024-03-19 DIAGNOSIS — J358 Other chronic diseases of tonsils and adenoids: Secondary | ICD-10-CM | POA: Diagnosis present

## 2024-03-19 DIAGNOSIS — J45909 Unspecified asthma, uncomplicated: Secondary | ICD-10-CM | POA: Diagnosis present

## 2024-03-19 DIAGNOSIS — J039 Acute tonsillitis, unspecified: Principal | ICD-10-CM | POA: Diagnosis present

## 2024-03-19 DIAGNOSIS — E66813 Obesity, class 3: Secondary | ICD-10-CM | POA: Diagnosis present

## 2024-03-19 DIAGNOSIS — Z6841 Body Mass Index (BMI) 40.0 and over, adult: Secondary | ICD-10-CM | POA: Diagnosis not present

## 2024-03-19 DIAGNOSIS — J4520 Mild intermittent asthma, uncomplicated: Secondary | ICD-10-CM | POA: Diagnosis not present

## 2024-03-19 DIAGNOSIS — F17210 Nicotine dependence, cigarettes, uncomplicated: Secondary | ICD-10-CM | POA: Diagnosis present

## 2024-03-19 DIAGNOSIS — Z720 Tobacco use: Secondary | ICD-10-CM | POA: Diagnosis not present

## 2024-03-19 DIAGNOSIS — Z79899 Other long term (current) drug therapy: Secondary | ICD-10-CM | POA: Diagnosis not present

## 2024-03-19 DIAGNOSIS — I10 Essential (primary) hypertension: Secondary | ICD-10-CM | POA: Diagnosis present

## 2024-03-19 LAB — COMPREHENSIVE METABOLIC PANEL WITH GFR
ALT: 19 U/L (ref 0–44)
AST: 16 U/L (ref 15–41)
Albumin: 4.3 g/dL (ref 3.5–5.0)
Alkaline Phosphatase: 42 U/L (ref 38–126)
Anion gap: 11 (ref 5–15)
BUN: 9 mg/dL (ref 6–20)
CO2: 26 mmol/L (ref 22–32)
Calcium: 9.6 mg/dL (ref 8.9–10.3)
Chloride: 100 mmol/L (ref 98–111)
Creatinine, Ser: 0.93 mg/dL (ref 0.61–1.24)
GFR, Estimated: >60 mL/min (ref >60)
Glucose, Bld: 123 mg/dL — ABNORMAL HIGH (ref 70–99)
Potassium: 3.7 mmol/L (ref 3.5–5.1)
Sodium: 137 mmol/L (ref 135–145)
Total Bilirubin: 1 mg/dL (ref 0.0–1.2)
Total Protein: 8.2 g/dL — ABNORMAL HIGH (ref 6.5–8.1)

## 2024-03-19 LAB — CBC WITH DIFFERENTIAL/PLATELET
Abs Immature Granulocytes: 0.05 10*3/uL (ref 0.00–0.07)
Basophils Absolute: 0 10*3/uL (ref 0.0–0.1)
Basophils Relative: 0 %
Eosinophils Absolute: 0 10*3/uL (ref 0.0–0.5)
Eosinophils Relative: 0 %
HCT: 44.3 % (ref 39.0–52.0)
Hemoglobin: 14.6 g/dL (ref 13.0–17.0)
Immature Granulocytes: 0 %
Lymphocytes Relative: 10 %
Lymphs Abs: 1.3 10*3/uL (ref 0.7–4.0)
MCH: 29.1 pg (ref 26.0–34.0)
MCHC: 33 g/dL (ref 30.0–36.0)
MCV: 88.2 fL (ref 80.0–100.0)
Monocytes Absolute: 1.3 10*3/uL — ABNORMAL HIGH (ref 0.1–1.0)
Monocytes Relative: 10 %
Neutro Abs: 10.7 10*3/uL — ABNORMAL HIGH (ref 1.7–7.7)
Neutrophils Relative %: 80 %
Platelets: 270 10*3/uL (ref 150–400)
RBC: 5.02 MIL/uL (ref 4.22–5.81)
RDW: 12.7 % (ref 11.5–15.5)
WBC: 13.4 10*3/uL — ABNORMAL HIGH (ref 4.0–10.5)
nRBC: 0 % (ref 0.0–0.2)

## 2024-03-19 LAB — LACTIC ACID, PLASMA: Lactic Acid, Venous: 1.1 mmol/L (ref 0.5–1.9)

## 2024-03-19 MED ORDER — ENOXAPARIN SODIUM 80 MG/0.8ML IJ SOSY
0.5000 mg/kg | PREFILLED_SYRINGE | INTRAMUSCULAR | Status: DC
  Filled 2024-03-19: qty 0.8

## 2024-03-19 MED ORDER — HYDRALAZINE HCL 20 MG/ML IJ SOLN: 10.0000 mg | INTRAMUSCULAR | Status: DC | PRN

## 2024-03-19 MED ORDER — PANTOPRAZOLE SODIUM 40 MG IV SOLR
40.0000 mg | INTRAVENOUS | Status: DC
  Administered 2024-03-19 – 2024-03-20 (×2): 40 mg via INTRAVENOUS
  Filled 2024-03-19 (×2): qty 10

## 2024-03-19 MED ORDER — ONDANSETRON HCL 4 MG/2ML IJ SOLN: 4.0000 mg | Freq: Four times a day (QID) | INTRAMUSCULAR | Status: DC | PRN

## 2024-03-19 MED ORDER — DEXAMETHASONE SODIUM PHOSPHATE 10 MG/ML IJ SOLN
10.0000 mg | INTRAMUSCULAR | Status: DC
  Administered 2024-03-20 – 2024-03-21 (×2): 10 mg via INTRAVENOUS
  Filled 2024-03-19 (×2): qty 1

## 2024-03-19 MED ORDER — SODIUM CHLORIDE 0.9 % IV SOLN
3.0000 g | Freq: Once | INTRAVENOUS | Status: AC
  Administered 2024-03-19: 3 g via INTRAVENOUS
  Filled 2024-03-19: qty 8

## 2024-03-19 MED ORDER — SODIUM CHLORIDE 0.9 % IV SOLN: INTRAVENOUS | Status: AC

## 2024-03-19 MED ORDER — SODIUM CHLORIDE 0.9 % IV SOLN
3.0000 g | Freq: Four times a day (QID) | INTRAVENOUS | Status: DC
  Administered 2024-03-19 – 2024-03-21 (×8): 3 g via INTRAVENOUS
  Filled 2024-03-19 (×10): qty 8

## 2024-03-19 MED ORDER — ONDANSETRON HCL 4 MG PO TABS: 4.0000 mg | ORAL_TABLET | Freq: Four times a day (QID) | ORAL | Status: DC | PRN

## 2024-03-19 MED ORDER — LOSARTAN POTASSIUM 50 MG PO TABS
50.0000 mg | ORAL_TABLET | Freq: Every day | ORAL | Status: DC
  Administered 2024-03-20 – 2024-03-21 (×2): 50 mg via ORAL
  Filled 2024-03-19: qty 1
  Filled 2024-03-19: qty 2

## 2024-03-19 MED ORDER — MORPHINE SULFATE (PF) 2 MG/ML IV SOLN: 2.0000 mg | INTRAVENOUS | Status: DC | PRN

## 2024-03-19 MED ORDER — DEXAMETHASONE SODIUM PHOSPHATE 10 MG/ML IJ SOLN
10.0000 mg | Freq: Once | INTRAMUSCULAR | Status: AC
  Administered 2024-03-19: 10 mg via INTRAVENOUS
  Filled 2024-03-19: qty 1

## 2024-03-19 MED ORDER — IOHEXOL 300 MG/ML  SOLN
75.0000 mL | Freq: Once | INTRAMUSCULAR | Status: AC | PRN
  Administered 2024-03-19: 75 mL via INTRAVENOUS

## 2024-03-19 MED ORDER — ALBUTEROL SULFATE (2.5 MG/3ML) 0.083% IN NEBU: 2.5000 mg | INHALATION_SOLUTION | RESPIRATORY_TRACT | Status: DC | PRN

## 2024-03-19 MED ORDER — SODIUM CHLORIDE 0.9 % IV BOLUS
1000.0000 mL | Freq: Once | INTRAVENOUS | Status: AC
  Administered 2024-03-19: 1000 mL via INTRAVENOUS

## 2024-03-19 NOTE — ED Notes (Signed)
Pt aware of NPO status and verbalizes understanding

## 2024-03-19 NOTE — H&P (Signed)
 [facility] H&P   Patient Demographics:    Tyrone Martin, is a 38 y.o. male  MRN: [id number]   DOB - [birth year]  Admit Date - 03/19/2024  Outpatient Primary MD for the patient is Caresse Chant, FNP  Referring MD/NP/PA: PA Celeste  Patient coming from: home  Chief Complaint  Patient presents with   Hypertension      HPI:    Tyrone Martin  is a 38 y.o. male, with past medical history of hypertension, tobacco use, asthma on albuterol , he presents to ED secondary to complaints of sore throat, hoarse voice, patient reports was seen 2 days ago in urgent care, for sore throat, had negative strep swab, was told he had Celestone, and discharged on penicillin, reports he has been having difficulty swallowing this morning, voice is extremely hoarse which prompted him to come to ED.  Ported take his losartan  this morning but blood pressure as well has been running high.  No chest pain, no shortness of breath, no nausea, no vomiting. - In the ED patient was low-grade temperature 99.4, had leukocytosis 13.4, CT head and neck showing left tonsillitis with phlegmon, pharyngitis, epiglottitis, with moderate to severe narrowing of oropharyngeal airways, ED discussed with ENT at [facility] Dr. Roslyn Coombe who recommended admission to [facility], started on IV steroids and antibiotics, [facility] hospitalist consulted to admit.    Review of systems:     A full 10 point Review of Systems was done, except as stated above, all other Review of Systems were negative.   With Past History of the following :    Past Medical History:  Diagnosis Date   Asthma       History reviewed. No pertinent surgical history.    Social History:     Social History   Tobacco Use   Smoking status: Every Day    Current packs/day: 0.10    Types: Cigarettes   Smokeless tobacco: Never  Substance Use Topics    Alcohol use: Yes    Comment: occasionally        Family History :    History reviewed. No pertinent family history.    Home Medications:   Prior to Admission medications   Medication Sig Start Date End Date Taking? Authorizing Provider  acetaminophen (TYLENOL) 500 MG tablet Take 1,000 mg by mouth every 6 (six) hours as needed for moderate pain (pain score 4-6) or headache.   Yes [provider]  albuterol  (VENTOLIN  HFA) 108 (90 Base) MCG/ACT inhaler Inhale 2 puffs into the lungs every 6 (six) hours as needed for wheezing or shortness of breath.   Yes [provider]  ibuprofen (ADVIL) 800 MG tablet Take 800 mg by mouth 3 (three) times daily as needed. 10/22/23  Yes [provider]  losartan  (COZAAR ) 50 MG tablet Take 50 mg by mouth daily.   Yes [provider]  penicillin  v potassium (VEETID) 500 MG tablet 1 tablet Orally Twice a day for 10 days 03/18/24  Yes [provider]     Allergies:     Allergies  Allergen Reactions   Shellfish Allergy      Physical Exam:   Vitals  Blood pressure (!) 159/102, pulse 100, temperature 99.4 F (37.4 C), temperature source Oral, resp. rate 18, height 5\' 7"  (1.702 m), weight 128.8 kg, SpO2 95%.   1. General Developed male, laying in bed, no apparent distress  2. Normal affect and insight, Not Suicidal or Homicidal, Awake Alert, Oriented X 3.  3. No F.N deficits, ALL C.Nerves Intact, Strength 5/5 all 4 extremities, Sensation intact all 4 extremities, Plantars down going.  4. Ears and Eyes appear Normal, Conjunctivae clear, PERRLA. Moist Oral Mucosa.  5.  Swollen neck with mild tenderness due to cervical lymphadenopathy, cavity examined, over the swollen, with erythema in the pharyngeal area, difficult to evaluate fully secondary to edema.  6. Symmetrical Chest wall movement, Good air movement bilaterally, CTAB.  7. RRR, No Gallops, Rubs or Murmurs, No Parasternal Heave.  8. Positive  Bowel Sounds, Abdomen Soft, No tenderness, No organomegaly appriciated,No rebound -guarding or rigidity.  9.  No Cyanosis, Normal Skin Turgor, No Skin Rash or Bruise.  10. Good muscle tone,  joints appear normal , no effusions, Normal ROM.    Data Review:    CBC Recent Labs  Lab 03/19/24 1159  WBC 13.4*  HGB 14.6  HCT 44.3  PLT 270  MCV 88.2  MCH 29.1  MCHC 33.0  RDW 12.7  LYMPHSABS 1.3  MONOABS 1.3*  EOSABS 0.0  BASOSABS 0.0   ------------------------------------------------------------------------------------------------------------------  Chemistries  Recent Labs  Lab 03/19/24 1159  NA 137  K 3.7  CL 100  CO2 26  GLUCOSE 123*  BUN 9  CREATININE 0.93  CALCIUM 9.6  AST 16  ALT 19  ALKPHOS 42  BILITOT 1.0   ------------------------------------------------------------------------------------------------------------------ estimated creatinine clearance is 141.6 mL/min (by C-G formula based on SCr of 0.93 mg/dL). ------------------------------------------------------------------------------------------------------------------ No results for input(s): "TSH", "T4TOTAL", "T3FREE", "THYROIDAB" in the last 72 hours.  Invalid input(s): "FREET3"  Coagulation profile No results for input(s): "INR", "PROTIME" in the last 168 hours. ------------------------------------------------------------------------------------------------------------------- No results for input(s): "DDIMER" in the last 72 hours. -------------------------------------------------------------------------------------------------------------------  Cardiac Enzymes No results for input(s): "CKMB", "TROPONINI", "MYOGLOBIN" in the last 168 hours.  Invalid input(s): "CK" ------------------------------------------------------------------------------------------------------------------ No results found for:  "BNP"   ---------------------------------------------------------------------------------------------------------------  Urinalysis No results found for: "COLORURINE", "APPEARANCEUR", "LABSPEC", "PHURINE", "GLUCOSEU", "HGBUR", "BILIRUBINUR", "KETONESUR", "PROTEINUR", "UROBILINOGEN", "NITRITE", "LEUKOCYTESUR"  ----------------------------------------------------------------------------------------------------------------   Imaging Results:    CT Soft Tissue Neck W Contrast Addendum Date: 03/19/2024 ADDENDUM REPORT: 03/19/2024 16:09 ADDENDUM: These results were called by telephone at the time of interpretation on 03/19/2024 at 3:53 pm to provider CELESTE BEATTY , who verbally acknowledged these results. Electronically Signed   By: Denny Flack M.D.   On: 03/19/2024 16:09   Result Date: 03/19/2024 CLINICAL DATA:  Concern for infection, sore throat EXAM: CT NECK WITH CONTRAST TECHNIQUE: Multidetector CT imaging of the neck was performed using the standard protocol following the bolus administration of intravenous contrast. RADIATION DOSE REDUCTION: This exam was performed according to the departmental dose-optimization program which includes automated exposure control, adjustment of the mA and/or kV according to patient size and/or use of iterative reconstruction technique. CONTRAST:  75mL OMNIPAQUE  IOHEXOL  300 MG/ML  SOLN COMPARISON:  None Available. FINDINGS: Pharynx and larynx: Significant asymmetric enlargement of the left palatine  tonsil resulting in moderate to severe narrowing of the oro pharyngeal airway. There is ill-defined hypoattenuation within the inferior aspect of the left palatine tonsil without definite peripheral enhancement concerning for phlegmonous change. Edema within the left parapharyngeal fat. Edema extends along the left lateral and posterior aspects of the pharyngeal mucosa extending inferiorly to the level of the supraglottic airway. There is thickening of the epiglottis likely  related to reactive edema. Edematous appearance of the left aryepiglottic fold with effacement of the left piriform sinus. There is mild narrowing of the supraglottic airway. There is also possible edema of the left vocal folds. Retropharyngeal effusion extending from the skull base to the level of C6-7 measuring up to 5 mm in thickness. Salivary glands: No inflammation, mass, or stone. Thyroid: Normal. Lymph nodes: Enlarged bilateral level 2A cervical nodes measuring up to 1.7 cm in short axis on the left and 1.3 cm on the right. There are numerous additional prominent level 1A, 1 B, and 2 B cervical nodes. Additional mildly prominent left level 3 cervical nodes. Vascular: Negative. Limited intracranial: Negative. Visualized orbits: Partially visualized orbits are unremarkable. Mastoids and visualized paranasal sinuses: Mastoid air cells are clear. Focal mucosal thickening in the medial aspect of the left maxillary sinus. Mucous retention cyst in the left maxillary sinus. Skeleton: No acute or aggressive process. Facet arthrosis on the left at C2-3. Dental caries. Periapical lucency involving the left maxillary second premolar. Upper chest: Negative. Other: None. IMPRESSION: Significant asymmetric enlargement of the left palatine tonsil concerning for tonsillitis. Ill-defined hypoattenuation in the inferior aspect of the left palatine tonsil suggestive of phlegmonous change. No definite peripheral enhancement to suggest abscess although the contrast timing is not optimal. Recommend ENT consultation. Associated moderate to severe narrowing of the oropharyngeal airway. Significant edema along the pharyngeal mucosa extending to the supraglottic airway and hypopharynx. Thickening of the left aryepiglottic fold with mild narrowing of the supraglottic airway. Thickening of the epiglottis likely reflecting reactive edema. Retropharyngeal effusion measuring 5 mm in maximum thickness. Cervical lymphadenopathy, likely  reactive. Electronically Signed: By: Denny Flack M.D. On: 03/19/2024 15:49     EKG:  Vent. rate 104 BPM PR interval 166 ms QRS duration 102 ms QT/QTcB 335/441 ms P-R-T axes 35 29 -7 Sinus tachycardia Borderline repolarization abnormality Borderline ST elevation, lateral leads Compared to previous tracing T waves now inverted in avf and V4-v6  Assessment & Plan:    Principal Problem:   Tonsillitis Active Problems:   Essential hypertension   Asthma, chronic   Obesity, Class III, BMI 40-49.9 (morbid obesity) (HCC)  Tonsillitis, pharyngitis, epiglottitis -As well imaging significant for moderate to severe narrowing of oropharyngeal airways. - Will be admitted to [facility] per ENT recommendation, significant narrowing of oropharyngeal airway, where he is high risk. - Continue with IV Unasyn . - On IV Decadron , will keep on 10 mg IV daily. - ENT to evaluate when patient at [facility]. - Keep n.p.o. for now, continue with IV fluids. - Will keep on as needed morphine  for pain.  Hypertension -he took his losartan  this morning, will add as needed hydralazine , if cannot swallow safely by tomorrow then will consider scheduled IV medications  Obesity class III - Body mass index is 44.48 kg/m. - Obesity complicates medical recovery  Asthma - No history of asthma, no active wheezing, will keep on as needed albuterol   Tobacco abuse - Will start on nicotine patch  DVT Prophylaxis  Lovenox    AM Labs Ordered, also please review Full Orders  Family Communication: Admission, patients condition and plan of care including tests being ordered have been discussed with the patient and* who indicate understanding and agree with the plan and Code Status.  Code Status full code  Likely DC to home  Consults called: ENT Dr. Roslyn Coombe by ED  Admission status: Inpatient  Time spent in minutes : 70 minutes   Seena Dadds M.D on 03/19/2024 at 5:21 PM   [facility] -  Office  [phone]

## 2024-03-19 NOTE — ED Notes (Signed)
Both sets of blood cultures obtained before any antibiotic administration  

## 2024-03-19 NOTE — ED Notes (Signed)
 PA-C made aware of elevated BP

## 2024-03-19 NOTE — ED Provider Notes (Signed)
 [place] EMERGENCY DEPARTMENT AT [facility] Provider Note   CSN: [id number] Arrival date & time: 03/19/24  1045     History  Chief Complaint  Patient presents with   Hypertension    Tyrone Martin is a 38 y.o. male.  Hypertension.  Presents to the ER for evaluation today of sore throat, hoarse voice.  This started 2 days ago.  Yesterday he was seen in urgent care, had a negative strep swab, told he had tonsil stones and discharged home.  Today he went back to urgent care because he was feeling worse, was having trouble swallowing and his voice was extremely hoarse.  He was seen and told he had a lot of swelling in the back of his throat and his blood pressure was high.  Come in the ER.  States blood pressure has been running high the past several months, he believes he is taking losartan , took it this morning.  No chest pain or shortness of breath, no headache or dizziness.  No nausea or vomiting.  He was unable to eat solid food last night due to the pain in his throat, able to drink a small amount of water this morning.   Hypertension       Home Medications Prior to Admission medications   Medication Sig Start Date End Date Taking? Authorizing Provider  acetaminophen (TYLENOL) 500 MG tablet Take 1,000 mg by mouth every 6 (six) hours as needed for moderate pain (pain score 4-6) or headache.   Yes [provider]  albuterol  (VENTOLIN  HFA) 108 (90 Base) MCG/ACT inhaler Inhale 2 puffs into the lungs every 6 (six) hours as needed for wheezing or shortness of breath.   Yes [provider]  ibuprofen (ADVIL) 800 MG tablet Take 800 mg by mouth 3 (three) times daily as needed. 10/22/23  Yes [provider]  losartan  (COZAAR ) 50 MG tablet Take 50 mg by mouth daily.   Yes [provider]  penicillin v potassium (VEETID) 500 MG tablet 1 tablet Orally Twice a day for 10 days 03/18/24  Yes [provider]      Allergies    Shellfish  allergy    Review of Systems   Review of Systems  Physical Exam Updated Vital Signs BP (!) 165/121   Pulse 93   Temp 99.3 F (37.4 C) (Oral)   Resp 16   Ht 5\' 7"  (1.702 m)   Wt 128.8 kg   SpO2 96%   BMI 44.48 kg/m  Physical Exam Vitals and nursing note reviewed.  Constitutional:      General: He is not in acute distress.    Appearance: He is well-developed.  HENT:     Head: Normocephalic and atraumatic.     Mouth/Throat:     Mouth: Mucous membranes are moist.     Comments: Patient speaks in full and clear sentences, handles oral secretions well, his uvula is very edematous and erythematous and is deviated to the right, left peritonsillar area is very full. Eyes:     Conjunctiva/sclera: Conjunctivae normal.  Cardiovascular:     Rate and Rhythm: Normal rate and regular rhythm.     Heart sounds: No murmur heard. Pulmonary:     Effort: Pulmonary effort is normal. No respiratory distress.     Breath sounds: Normal breath sounds.  Abdominal:     Palpations: Abdomen is soft.     Tenderness: There is no abdominal tenderness.  Musculoskeletal:        General:  No swelling.     Cervical back: Neck supple.  Skin:    General: Skin is warm and dry.     Capillary Refill: Capillary refill takes less than 2 seconds.  Neurological:     Mental Status: He is alert.  Psychiatric:        Mood and Affect: Mood normal.     ED Results / Procedures / Treatments   Labs (all labs ordered are listed, but only abnormal results are displayed) Labs Reviewed  CBC WITH DIFFERENTIAL/PLATELET - Abnormal; Notable for the following components:      Result Value   WBC 13.4 (*)    Neutro Abs 10.7 (*)    Monocytes Absolute 1.3 (*)    All other components within normal limits  COMPREHENSIVE METABOLIC PANEL WITH GFR - Abnormal; Notable for the following components:   Glucose, Bld 123 (*)    Total Protein 8.2 (*)    All other components within normal limits    EKG EKG  Interpretation Date/Time:  Wednesday March 19 2024 11:23:02 EDT Ventricular Rate:  104 PR Interval:  166 QRS Duration:  102 QT Interval:  335 QTC Calculation: 441 R Axis:   29  Text Interpretation: Sinus tachycardia Borderline repolarization abnormality Borderline ST elevation, lateral leads Compared to previous tracing T waves now inverted in avf and V4-v6 Confirmed by Hiawatha Lout ([id number]) on 03/19/2024 11:32:30 AM  Radiology No results found.  Procedures Procedures    Medications Ordered in ED Medications  sodium chloride  0.9 % bolus 1,000 mL (has no administration in time range)  dexamethasone  (DECADRON ) injection 10 mg (10 mg Intravenous Given 03/19/24 1212)    ED Course/ Medical Decision Making/ A&P                                 Medical Decision Making This patient presents to the ED for concern of sore throat, elevated BP, this involves an extensive number of treatment options, and is a complaint that carries with it a high risk of complications and morbidity.  The differential diagnosis includes tonsillitis, uvulitis, peritonsillar abscess, retropharyngeal abscess, GERD, other   Co morbidities that complicate the patient evaluation :   Hypertension   Additional history obtained:  Additional history obtained from EMR External records from outside source obtained and reviewed including prior notes and labs   Lab Tests:  I Ordered, and personally interpreted labs.  The pertinent results include: CBC-leukocytosis white blood cell count 13.4 CMP-essentially normal Lactic acid-normal   Imaging Studies ordered:  I ordered imaging studies including CT soft tissue neck which shows significant swelling to left palatine tonsil phlegmonous change without definitive abscess; moderate to severe narrowing of oropharyngeal airway I independently visualized and interpreted imaging within scope of identifying emergent findings  I agree with the radiologist  interpretation   Cardiac Monitoring: / EKG:  The patient was maintained on a cardiac monitor.  I personally viewed and interpreted the cardiac monitored which showed an underlying rhythm of: Sinus rhythm   Consultations Obtained:  I requested consultation with the on-call ENT doctor Vandegriend,  and discussed lab and imaging findings as well as pertinent plan - they recommend: Continue with IV steroids and antibiotics, admit to the service [facility].  He can see patient once they get their be available in case patient has worsening symptoms or needs procedure but states in general these respond well to steroids and antibiotics  Consulted with the  hospitalist Dr. Osborne Blazer.  He is agreeable with admission to [facility] by hospitalist service.   Problem List / ED Course / Critical interventions / Medication management Tonsillitis-patient has left tonsillitis no definite abscess, initially had hoarse voice and significant pain and blood pressure was elevated though after treatment with steroids, antibiotics and IV fluids he is feeling much better, able to lay recumbent Merlyn Starring, handling oral secretions well, voice changes have improved and blood pressure is now normal.  He is agreeable with admission. I ordered medication including dexamethasone   for tonsillitis   Reevaluation of the patient after these medicines showed that the patient improved I have reviewed the patients home medicines and have made adjustments as needed       Amount and/or Complexity of Data Reviewed Labs: ordered. Radiology: ordered.  Risk Prescription drug management. Decision regarding hospitalization.           Final Clinical Impression(s) / ED Diagnoses Final diagnoses:  None    Rx / DC Orders ED Discharge Orders     None         Joshua Nieves 03/19/24 1807    Mordecai Applebaum, MD 03/22/24 1505

## 2024-03-19 NOTE — ED Notes (Signed)
 Patient transported to CT

## 2024-03-19 NOTE — ED Triage Notes (Signed)
 Pt c/o of a sore throat and hypertension. Pt was told at [facility] he had tonsilstones and high bp

## 2024-03-20 DIAGNOSIS — I10 Essential (primary) hypertension: Secondary | ICD-10-CM

## 2024-03-20 DIAGNOSIS — J039 Acute tonsillitis, unspecified: Secondary | ICD-10-CM | POA: Diagnosis not present

## 2024-03-20 DIAGNOSIS — J4520 Mild intermittent asthma, uncomplicated: Secondary | ICD-10-CM | POA: Diagnosis not present

## 2024-03-20 DIAGNOSIS — Z720 Tobacco use: Secondary | ICD-10-CM

## 2024-03-20 LAB — BASIC METABOLIC PANEL WITH GFR
Anion gap: 9 (ref 5–15)
BUN: 12 mg/dL (ref 6–20)
CO2: 24 mmol/L (ref 22–32)
Calcium: 9 mg/dL (ref 8.9–10.3)
Chloride: 104 mmol/L (ref 98–111)
Creatinine, Ser: 0.86 mg/dL (ref 0.61–1.24)
GFR, Estimated: >60 mL/min (ref >60)
Glucose, Bld: 129 mg/dL — ABNORMAL HIGH (ref 70–99)
Potassium: 4.1 mmol/L (ref 3.5–5.1)
Sodium: 137 mmol/L (ref 135–145)

## 2024-03-20 LAB — CBC
HCT: 41.9 % (ref 39.0–52.0)
Hemoglobin: 13.9 g/dL (ref 13.0–17.0)
MCH: 29.3 pg (ref 26.0–34.0)
MCHC: 33.2 g/dL (ref 30.0–36.0)
MCV: 88.2 fL (ref 80.0–100.0)
Platelets: 298 10*3/uL (ref 150–400)
RBC: 4.75 MIL/uL (ref 4.22–5.81)
RDW: 12.9 % (ref 11.5–15.5)
WBC: 15.2 10*3/uL — ABNORMAL HIGH (ref 4.0–10.5)
nRBC: 0 % (ref 0.0–0.2)

## 2024-03-20 LAB — HIV ANTIBODY (ROUTINE TESTING W REFLEX): HIV Screen 4th Generation wRfx: NONREACTIVE

## 2024-03-20 NOTE — Assessment & Plan Note (Signed)
-  Cessation counseling provided -Continue nicotine patch. 

## 2024-03-20 NOTE — Assessment & Plan Note (Signed)
-   Vital signs stable - Continue losartan  - Continue as needed hydralazine  - Low-sodium diet discussed with patient.

## 2024-03-20 NOTE — Progress Notes (Signed)
 Progress Note   Patient: Tyrone Martin WGN:[id number] DOB: 03-Sep-1986 DOA: 03/19/2024     1 DOS: the patient was seen and examined on 03/20/2024   Brief hospital admission narrative: As per H&P written by Dr. Osborne Blazer on 03/19/2024 Tyrone Martin  is a 38 y.o. male, with past medical history of hypertension, tobacco use, asthma on albuterol , he presents to ED secondary to complaints of sore throat, hoarse voice, patient reports was seen 2 days ago in urgent care, for sore throat, had negative strep swab, was told he had Celestone, and discharged on penicillin, reports he has been having difficulty swallowing this morning, voice is extremely hoarse which prompted him to come to ED.  Ported take his losartan  this morning but blood pressure as well has been running high.  No chest pain, no shortness of breath, no nausea, no vomiting. - In the ED patient was low-grade temperature 99.4, had leukocytosis 13.4, CT head and neck showing left tonsillitis with phlegmon, pharyngitis, epiglottitis, with moderate to severe narrowing of oropharyngeal airways, ED discussed with ENT at [facility] Dr. Roslyn Coombe who recommended admission to [facility], started on IV steroids and antibiotics, [facility] hospitalist consulted to admit.   Assessment and plan  Tonsillitis - CT scan demonstrating severe tonsillitis, pharyngitis and epiglottitis - Case discussed with ENT with recommendations to transfer to [facility] for further evaluation and management - Will continue treatment with IV Unasyn , IV Decadron  and keep patient NPO. -Continue adequate hydration with IV fluids and provide as needed analgesics - Follow clinical response. - Good oxygen saturation currently appreciated - Patient is afebrile - WBCs up to 15.2 with confounding stress demargination from the steroids.  Essential hypertension - Vital signs stable - Continue losartan  - Continue as needed hydralazine  - Low-sodium diet  discussed with patient.  Asthma, chronic - No exacerbation currently appreciated - Continue as needed bronchodilators.  Obesity, Class III, BMI 40-49.9 (morbid obesity) (HCC) -Body mass index is 44.48 kg/m. - Low-calorie diet, portion control and increased physical activity discussed with patient.  Tobacco abuse - Cessation counseling provided - Continue nicotine patch.  Subjective:  Afebrile, no chest pain, no nausea, no vomiting.  Expressed feeling stable.  Still mild discomfort swallowing reported.  Physical Exam: Vitals:   03/20/24 0449 03/20/24 0500 03/20/24 0644 03/20/24 0708  BP: 124/78 119/74 119/74 120/77  Pulse: 74 65 77 81  Resp:   16 18  Temp:   97.8 F (36.6 C) 98.1 F (36.7 C)  TempSrc:   Oral Oral  SpO2: 97% 97% 97% 97%  Weight:      Height:       General exam: Alert, awake, oriented x 3; in no major distress.  Protecting airways. Respiratory system: Clear to auscultation.  No wheezing, no crackles, not using accessory muscles. Cardiovascular system:RRR. No murmurs, rubs, gallops. Gastrointestinal system: Abdomen is obese, nondistended, soft and nontender. No organomegaly or masses felt. Normal bowel sounds heard. Central nervous system: No focal neurological deficits. Extremities: No cyanosis or clubbing. Skin: No rashes, petechiae or induration. Psychiatry: Judgement and insight appear normal. Mood & affect appropriate.    Data Reviewed: HIV antibody: Pending Basic metabolic panel: Sodium 137, potassium 4.1, chloride 104, bicarb 24, BUN 12, creatinine 0.86, GFR >60 CBC: WBCs 15.2, hemoglobin 13.9 and platelet count 298K  Family Communication: No family at bedside.  Disposition: Status is: Inpatient Remains inpatient appropriate because: Continue IV antibiotics.  Anticipate discharge home when medically stable.  Time spent: 50 minutes  Author: Constantino Martin  Tyrone Admire, MD 03/20/2024 7:40 AM  For on call review [email or web address].

## 2024-03-20 NOTE — ED Notes (Signed)
 Pt assisted to recliner. Mother visiting pt at this time. Call light within reach. No other needs.

## 2024-03-20 NOTE — Assessment & Plan Note (Signed)
-  Body mass index is 44.48 kg/m. - Low-calorie diet, portion control and increased physical activity discussed with patient.

## 2024-03-20 NOTE — Assessment & Plan Note (Signed)
-   No exacerbation currently appreciated - Continue as needed bronchodilators.

## 2024-03-20 NOTE — Assessment & Plan Note (Signed)
-   CT scan demonstrating severe tonsillitis, pharyngitis and epiglottitis - Case discussed with ENT with recommendations to transfer to [facility] for further evaluation and management - Will continue treatment with IV Unasyn , IV Decadron  and keep patient NPO. -Continue adequate hydration with IV fluids and provide as needed analgesics - Follow clinical response. - Good oxygen saturation currently appreciated - Patient is afebrile - WBCs up to 15.2 with confounding stress demargination from the steroids.

## 2024-03-20 NOTE — ED Notes (Signed)
 Attempted to call report to [facility]. Renee states nurse is on the phone at this time and requests call back. Will attempt to call back.

## 2024-03-21 DIAGNOSIS — I10 Essential (primary) hypertension: Secondary | ICD-10-CM | POA: Diagnosis not present

## 2024-03-21 DIAGNOSIS — J039 Acute tonsillitis, unspecified: Secondary | ICD-10-CM | POA: Diagnosis not present

## 2024-03-21 DIAGNOSIS — J4520 Mild intermittent asthma, uncomplicated: Secondary | ICD-10-CM | POA: Diagnosis not present

## 2024-03-21 LAB — CBC WITH DIFFERENTIAL/PLATELET
Abs Immature Granulocytes: 0.06 10*3/uL (ref 0.00–0.07)
Basophils Absolute: 0 10*3/uL (ref 0.0–0.1)
Basophils Relative: 0 %
Eosinophils Absolute: 0 10*3/uL (ref 0.0–0.5)
Eosinophils Relative: 0 %
HCT: 41.2 % (ref 39.0–52.0)
Hemoglobin: 13.7 g/dL (ref 13.0–17.0)
Immature Granulocytes: 0 %
Lymphocytes Relative: 21 %
Lymphs Abs: 2.8 10*3/uL (ref 0.7–4.0)
MCH: 29.3 pg (ref 26.0–34.0)
MCHC: 33.3 g/dL (ref 30.0–36.0)
MCV: 88 fL (ref 80.0–100.0)
Monocytes Absolute: 1.4 10*3/uL — ABNORMAL HIGH (ref 0.1–1.0)
Monocytes Relative: 10 %
Neutro Abs: 9.5 10*3/uL — ABNORMAL HIGH (ref 1.7–7.7)
Neutrophils Relative %: 69 %
Platelets: 306 10*3/uL (ref 150–400)
RBC: 4.68 MIL/uL (ref 4.22–5.81)
RDW: 12.8 % (ref 11.5–15.5)
WBC: 13.8 10*3/uL — ABNORMAL HIGH (ref 4.0–10.5)
nRBC: 0 % (ref 0.0–0.2)

## 2024-03-21 LAB — BASIC METABOLIC PANEL WITH GFR
Anion gap: 7 (ref 5–15)
BUN: 13 mg/dL (ref 6–20)
CO2: 29 mmol/L (ref 22–32)
Calcium: 9.3 mg/dL (ref 8.9–10.3)
Chloride: 104 mmol/L (ref 98–111)
Creatinine, Ser: 0.98 mg/dL (ref 0.61–1.24)
GFR, Estimated: >60 mL/min (ref >60)
Glucose, Bld: 106 mg/dL — ABNORMAL HIGH (ref 70–99)
Potassium: 4 mmol/L (ref 3.5–5.1)
Sodium: 140 mmol/L (ref 135–145)

## 2024-03-21 MED ORDER — DEXAMETHASONE 4 MG PO TABS
4.0000 mg | ORAL_TABLET | Freq: Every day | ORAL | 0 refills | Status: AC
Start: 2024-03-22 — End: 2024-03-27

## 2024-03-21 MED ORDER — AMOXICILLIN-POT CLAVULANATE 875-125 MG PO TABS: 1.0000 | ORAL_TABLET | Freq: Two times a day (BID) | ORAL | 0 refills | Status: AC

## 2024-03-21 NOTE — Assessment & Plan Note (Addendum)
 03-21-2024 pt states he is symptomatically improved 100% since admission. On IV unasyn  and decadron . Pt states he wants to be discharged today. Awaiting ENT evaluation.  *update. Seen by ENT. Had bedside nasolaryngoscopy was performed. This showed resolution of the swelling at the larynx and supraglottic level. There was moderate swelling along the superior aspect of the lateral pharyngeal wall and tonsillar region.   Ok for DC by ENT. Rx for Augmentin x 7 days and 5 days of decadron . F/u with ENT in office. Referral made.

## 2024-03-21 NOTE — Discharge Summary (Signed)
 [facility] Physician Discharge Summary   Patient name: Tyrone Martin  Admit date:     03/19/2024  Discharge date: 03/21/2024  Attending Physician: Epifanio Haste [[id number]]  Discharge Physician: Unk Garb   PCP: Caresse Chant, FNP  Admitted From: Home  Disposition:  Home  Recommendations for Outpatient Follow-up:  Follow up with PCP in 1-2 weeks F/U with outpatient ENT. Dr Roslyn Coombe. [facility].  Home Health:No Equipment/Devices: None  Discharge Condition:Stable CODE STATUS:FULL Diet recommendation: Regular Fluid Restriction: None  Hospital Summary: HPI: Tyrone Martin  is a 38 y.o. male, with past medical history of hypertension, tobacco use, asthma on albuterol , he presents to ED secondary to complaints of sore throat, hoarse voice, patient reports was seen 2 days ago in urgent care, for sore throat, had negative strep swab, was told he had Celestone, and discharged on penicillin, reports he has been having difficulty swallowing this morning, voice is extremely hoarse which prompted him to come to ED.  Ported take his losartan  this morning but blood pressure as well has been running high.  No chest pain, no shortness of breath, no nausea, no vomiting. - In the ED patient was low-grade temperature 99.4, had leukocytosis 13.4, CT head and neck showing left tonsillitis with phlegmon, pharyngitis, epiglottitis, with moderate to severe narrowing of oropharyngeal airways, ED discussed with ENT at [facility] Dr. Roslyn Coombe who recommended admission to [facility], started on IV steroids and antibiotics, [facility] consulted to admit.  Significant Events: Admitted 03/19/2024 for acute epiglottitis   Significant Labs: WBC 13.4, HgB 14.6, plt 270 Na 137, K 3.7, CO2 of 26, BUN 9, Scr 0.93, glu 123  Significant Imaging Studies: CT soft tissue neck Significant asymmetric enlargement of the left palatine tonsil concerning for tonsillitis. Ill-defined  hypoattenuation in the inferior aspect of the left palatine tonsil suggestive of phlegmonous change. No definite peripheral enhancement to suggest abscess although the contrast timing is not optimal. Recommend ENT consultation. Associated moderate to severe narrowing of the oropharyngeal airway. Significant edema along the pharyngeal mucosa extending to the supraglottic airway and hypopharynx. Thickening of the left aryepiglottic fold with mild narrowing of the supraglottic airway.  Thickening of the epiglottis likely reflecting reactive edema.  Retropharyngeal effusion measuring 5 mm in maximum thickness. Cervical lymphadenopathy, likely reactive.  Antibiotic Therapy: Anti-infectives (From admission, onward)    Start     Dose/Rate Route Frequency Ordered Stop   03/19/24 2200  Ampicillin -Sulbactam (UNASYN ) 3 g in sodium chloride  0.9 % 100 mL IVPB        3 g 200 mL/hr over 30 Minutes Intravenous Every 6 hours 03/19/24 1720     03/19/24 1600  Ampicillin -Sulbactam (UNASYN ) 3 g in sodium chloride  0.9 % 100 mL IVPB        3 g 200 mL/hr over 30 Minutes Intravenous  Once 03/19/24 1557 03/19/24 1731       Procedures: Flexible nasopharyngoscopy  Consultants: ENT   Hospital Course by Problem: * Tonsillitis 03-21-2024 pt states he is symptomatically improved 100% since admission. On IV unasyn  and decadron . Pt states he wants to be discharged today. Awaiting ENT evaluation.  *update. Seen by ENT. Had bedside nasolaryngoscopy was performed. This showed resolution of the swelling at the larynx and supraglottic level. There was moderate swelling along the superior aspect of the lateral pharyngeal wall and tonsillar region.   Ok for DC by ENT. Rx for Augmentin x 7 days and 5 days of decadron . F/u with ENT in office. Referral made.  Tobacco abuse 03-21-2024 advised to quit smoking  Obesity, Class III, BMI 40-49.9 (morbid obesity) (HCC) Body mass index is 44.68 kg/m.   Asthma,  chronic 03-21-2024 stable.  Essential hypertension 03-21-2024 continue cozaar .    Discharge Diagnoses:  Principal Problem:   Tonsillitis Active Problems:   Essential hypertension   Asthma, chronic   Obesity, Class III, BMI 40-49.9 (morbid obesity) (HCC)   Tobacco abuse   Discharge Instructions  Discharge Instructions     Ambulatory referral to ENT   Complete by: As directed    Call MD for:  difficulty breathing, headache or visual disturbances   Complete by: As directed    Call MD for:  extreme fatigue   Complete by: As directed    Call MD for:  hives   Complete by: As directed    Call MD for:  persistant dizziness or light-headedness   Complete by: As directed    Call MD for:  persistant nausea and vomiting   Complete by: As directed    Call MD for:  severe uncontrolled pain   Complete by: As directed    Call MD for:  temperature >100.4   Complete by: As directed    Diet - low sodium heart healthy   Complete by: As directed    Discharge instructions   Complete by: As directed    1. Follow up with your primary care provider in 1-2 weeks following discharge from hospital. 2. Ambulatory referral made to ENT(ear, nose, throat surgeon). Office will call you for appointment.   Increase activity slowly   Complete by: As directed       Allergies as of 03/21/2024       Reactions   Shellfish Allergy         Medication List     STOP taking these medications    penicillin v potassium 500 MG tablet Commonly known as: VEETID       TAKE these medications    acetaminophen 500 MG tablet Commonly known as: TYLENOL Take 1,000 mg by mouth every 6 (six) hours as needed for moderate pain (pain score 4-6) or headache.   albuterol  108 (90 Base) MCG/ACT inhaler Commonly known as: VENTOLIN  HFA Inhale 2 puffs into the lungs every 6 (six) hours as needed for wheezing or shortness of breath.   amoxicillin -clavulanate 875-125 MG tablet Commonly known as:  AUGMENTIN Take 1 tablet by mouth 2 (two) times daily for 7 days.   dexamethasone  4 MG tablet Commonly known as: Decadron  Take 1 tablet (4 mg total) by mouth daily for 5 days. Start taking on: March 22, 2024   ibuprofen 800 MG tablet Commonly known as: ADVIL Take 800 mg by mouth 3 (three) times daily as needed.   losartan  50 MG tablet Commonly known as: COZAAR  Take 50 mg by mouth daily.        Allergies  Allergen Reactions   Shellfish Allergy     Discharge Exam: Vitals:   03/21/24 0331 03/21/24 0857  BP: (!) 136/96 (!) 147/92  Pulse: 60 85  Resp: 18 13  Temp: 98 F (36.7 C) 98.5 F (36.9 C)  SpO2: 96% 97%    Physical Exam Vitals and nursing note reviewed.  Constitutional:      General: He is not in acute distress.    Appearance: He is obese. He is not toxic-appearing or diaphoretic.  HENT:     Head: Normocephalic and atraumatic.     Nose: Nose normal.     Mouth/Throat:  Tonsils: 3+ on the left.  Eyes:     General: No scleral icterus. Cardiovascular:     Rate and Rhythm: Normal rate and regular rhythm.  Pulmonary:     Effort: Pulmonary effort is normal. No respiratory distress.     Breath sounds: Normal breath sounds. No wheezing or rales.  Abdominal:     General: Bowel sounds are normal. There is no distension.     Palpations: Abdomen is soft.     Tenderness: There is no abdominal tenderness. There is no guarding.  Musculoskeletal:     Right lower leg: No edema.     Left lower leg: No edema.  Skin:    General: Skin is warm and dry.     Capillary Refill: Capillary refill takes less than 2 seconds.  Neurological:     General: No focal deficit present.     Mental Status: He is alert and oriented to person, place, and time.     The results of significant diagnostics from this hospitalization (including imaging, microbiology, ancillary and laboratory) are listed below for reference.    Microbiology: Recent Results (from the past 240 hours)  Blood  culture (routine x 2)     Status: None (Preliminary result)   Collection Time: 03/19/24  4:12 PM   Specimen: BLOOD  Result Value Ref Range Status   Specimen Description BLOOD RIGHT ANTECUBITAL  Final   Special Requests   Final    BOTTLES DRAWN AEROBIC AND ANAEROBIC Blood Culture adequate volume   Culture   Final    NO GROWTH 2 DAYS Performed at [facility], [address] [place], [place] [address]    Report Status PENDING  Incomplete  Blood culture (routine x 2)     Status: None (Preliminary result)   Collection Time: 03/19/24  4:12 PM   Specimen: BLOOD  Result Value Ref Range Status   Specimen Description BLOOD LEFT ANTECUBITAL  Final   Special Requests   Final    BOTTLES DRAWN AEROBIC AND ANAEROBIC Blood Culture results may not be optimal due to an inadequate volume of blood received in culture bottles   Culture   Final    NO GROWTH 2 DAYS Performed at [facility], [address] [place], [place] [address]    Report Status PENDING  Incomplete     Labs: Basic Metabolic Panel: Recent Labs  Lab 03/19/24 1159 03/20/24 0254 03/21/24 0817  NA 137 137 140  K 3.7 4.1 4.0  CL 100 104 104  CO2 26 24 29   GLUCOSE 123* 129* 106*  BUN 9 12 13   CREATININE 0.93 0.86 0.98  CALCIUM 9.6 9.0 9.3   Liver Function Tests: Recent Labs  Lab 03/19/24 1159  AST 16  ALT 19  ALKPHOS 42  BILITOT 1.0  PROT 8.2*  ALBUMIN 4.3   CBC: Recent Labs  Lab 03/19/24 1159 03/20/24 0254 03/21/24 0817  WBC 13.4* 15.2* 13.8*  NEUTROABS 10.7*  --  9.5*  HGB 14.6 13.9 13.7  HCT 44.3 41.9 41.2  MCV 88.2 88.2 88.0  PLT 270 298 306   Sepsis Labs Recent Labs  Lab 03/19/24 1159 03/20/24 0254 03/21/24 0817  WBC 13.4* 15.2* 13.8*    Procedures/Studies: CT Soft Tissue Neck W Contrast Addendum Date: 03/19/2024 ADDENDUM REPORT: 03/19/2024 16:09 ADDENDUM: These results were called by telephone at the time of interpretation on 03/19/2024 at 3:53 pm to provider CELESTE BEATTY , who verbally  acknowledged these results. Electronically Signed   By: Denny Flack  M.D.   On: 03/19/2024 16:09   Result Date: 03/19/2024 CLINICAL DATA:  Concern for infection, sore throat EXAM: CT NECK WITH CONTRAST TECHNIQUE: Multidetector CT imaging of the neck was performed using the standard protocol following the bolus administration of intravenous contrast. RADIATION DOSE REDUCTION: This exam was performed according to the departmental dose-optimization program which includes automated exposure control, adjustment of the mA and/or kV according to patient size and/or use of iterative reconstruction technique. CONTRAST:  75mL OMNIPAQUE  IOHEXOL  300 MG/ML  SOLN COMPARISON:  None Available. FINDINGS: Pharynx and larynx: Significant asymmetric enlargement of the left palatine tonsil resulting in moderate to severe narrowing of the oro pharyngeal airway. There is ill-defined hypoattenuation within the inferior aspect of the left palatine tonsil without definite peripheral enhancement concerning for phlegmonous change. Edema within the left parapharyngeal fat. Edema extends along the left lateral and posterior aspects of the pharyngeal mucosa extending inferiorly to the level of the supraglottic airway. There is thickening of the epiglottis likely related to reactive edema. Edematous appearance of the left aryepiglottic fold with effacement of the left piriform sinus. There is mild narrowing of the supraglottic airway. There is also possible edema of the left vocal folds. Retropharyngeal effusion extending from the skull base to the level of C6-7 measuring up to 5 mm in thickness. Salivary glands: No inflammation, mass, or stone. Thyroid: Normal. Lymph nodes: Enlarged bilateral level 2A cervical nodes measuring up to 1.7 cm in short axis on the left and 1.3 cm on the right. There are numerous additional prominent level 1A, 1 B, and 2 B cervical nodes. Additional mildly prominent left level 3 cervical nodes. Vascular: Negative.  Limited intracranial: Negative. Visualized orbits: Partially visualized orbits are unremarkable. Mastoids and visualized paranasal sinuses: Mastoid air cells are clear. Focal mucosal thickening in the medial aspect of the left maxillary sinus. Mucous retention cyst in the left maxillary sinus. Skeleton: No acute or aggressive process. Facet arthrosis on the left at C2-3. Dental caries. Periapical lucency involving the left maxillary second premolar. Upper chest: Negative. Other: None. IMPRESSION: Significant asymmetric enlargement of the left palatine tonsil concerning for tonsillitis. Ill-defined hypoattenuation in the inferior aspect of the left palatine tonsil suggestive of phlegmonous change. No definite peripheral enhancement to suggest abscess although the contrast timing is not optimal. Recommend ENT consultation. Associated moderate to severe narrowing of the oropharyngeal airway. Significant edema along the pharyngeal mucosa extending to the supraglottic airway and hypopharynx. Thickening of the left aryepiglottic fold with mild narrowing of the supraglottic airway. Thickening of the epiglottis likely reflecting reactive edema. Retropharyngeal effusion measuring 5 mm in maximum thickness. Cervical lymphadenopathy, likely reactive. Electronically Signed: By: Denny Flack M.D. On: 03/19/2024 15:49    Time coordinating discharge: 50 mins  SIGNED:  Unk Garb, DO [facility] 03/21/24, 1:15 PM

## 2024-03-21 NOTE — Assessment & Plan Note (Signed)
 03-21-2024 advised to quit smoking

## 2024-03-21 NOTE — Assessment & Plan Note (Signed)
 Body mass index is 44.68 kg/m.

## 2024-03-21 NOTE — Assessment & Plan Note (Signed)
 03-21-2024 continue cozaar .

## 2024-03-21 NOTE — Progress Notes (Signed)
 Discharge instructions reviewed with pt and his mother.  Copy of instructions given to pt. Pt informed his scripts were sent to his pharmacy for pick up. Pt instructed to call the ENT office if he does not hear from them by Monday afternoon next week (office to call pt to set up follow up appointment).  Pt will be d/c'd via wheelchair with belongings and will be escorted by staff.   Dravin Lance,RN SWOT

## 2024-03-21 NOTE — Hospital Course (Addendum)
 HPI: Tyrone Martin  is a 38 y.o. male, with past medical history of hypertension, tobacco use, asthma on albuterol , he presents to ED secondary to complaints of sore throat, hoarse voice, patient reports was seen 2 days ago in urgent care, for sore throat, had negative strep swab, was told he had Celestone, and discharged on penicillin, reports he has been having difficulty swallowing this morning, voice is extremely hoarse which prompted him to come to ED.  Ported take his losartan  this morning but blood pressure as well has been running high.  No chest pain, no shortness of breath, no nausea, no vomiting. - In the ED patient was low-grade temperature 99.4, had leukocytosis 13.4, CT head and neck showing left tonsillitis with phlegmon, pharyngitis, epiglottitis, with moderate to severe narrowing of oropharyngeal airways, ED discussed with ENT at [facility] Dr. Roslyn Coombe who recommended admission to [facility], started on IV steroids and antibiotics, [facility] consulted to admit.  Significant Events: Admitted 03/19/2024 for acute epiglottitis   Significant Labs: WBC 13.4, HgB 14.6, plt 270 Na 137, K 3.7, CO2 of 26, BUN 9, Scr 0.93, glu 123  Significant Imaging Studies: CT soft tissue neck Significant asymmetric enlargement of the left palatine tonsil concerning for tonsillitis. Ill-defined hypoattenuation in the inferior aspect of the left palatine tonsil suggestive of phlegmonous change. No definite peripheral enhancement to suggest abscess although the contrast timing is not optimal. Recommend ENT consultation. Associated moderate to severe narrowing of the oropharyngeal airway. Significant edema along the pharyngeal mucosa extending to the supraglottic airway and hypopharynx. Thickening of the left aryepiglottic fold with mild narrowing of the supraglottic airway.  Thickening of the epiglottis likely reflecting reactive edema.  Retropharyngeal effusion measuring 5 mm in maximum  thickness. Cervical lymphadenopathy, likely reactive.  Antibiotic Therapy: Anti-infectives (From admission, onward)    Start     Dose/Rate Route Frequency Ordered Stop   03/19/24 2200  Ampicillin -Sulbactam (UNASYN ) 3 g in sodium chloride  0.9 % 100 mL IVPB        3 g 200 mL/hr over 30 Minutes Intravenous Every 6 hours 03/19/24 1720     03/19/24 1600  Ampicillin -Sulbactam (UNASYN ) 3 g in sodium chloride  0.9 % 100 mL IVPB        3 g 200 mL/hr over 30 Minutes Intravenous  Once 03/19/24 1557 03/19/24 1731       Procedures: Flexible nasopharyngoscopy  Consultants: ENT

## 2024-03-21 NOTE — Consult Note (Signed)
 ENT CONSULT:  Reason for Consult: Throat swelling  Referring Physician: Hospitalist service  HPI: Tyrone Martin is an 38 y.o. male who presented to [facility] emergency room 2 days ago with worsening throat pain, swallowing and voice changes.  He reports that this started 5 days ago after he was eating and was vomiting significantly and approximate 1 hour after this he noticed that he had significant throat pain and swelling.  He was seen at the [facility] emergency room and is discussed the case with the provider there who obtained a CT scan which upon my independent review showed a rather diffuse swelling along the left lateral oropharyngeal wall extending from the nasopharynx down to the supraglottic area without definable fluid or abscess collection.  He was awaiting transfer which recently happened and had been continued on steroids and IV antibiotics as this was presumed infectious due to his low-grade leukocytosis.  He reports that he is otherwise doing well today and much improved since he was seen in the emergency room 2 days ago.  He just finished eating a pork chop   Past Medical History:  Diagnosis Date   Asthma     History reviewed. No pertinent surgical history.  History reviewed. No pertinent family history.  Social History:  reports that he has been smoking cigarettes. He has never used smokeless tobacco. He reports current alcohol use. He reports that he does not use drugs.  Allergies:  Allergies  Allergen Reactions   Shellfish Allergy     Medications: I have reviewed the patient's current medications.  Results for orders placed or performed during the hospital encounter of 03/19/24 (from the past 48 hours)  Blood culture (routine x 2)     Status: None (Preliminary result)   Collection Time: 03/19/24  4:12 PM   Specimen: BLOOD  Result Value Ref Range   Specimen Description BLOOD RIGHT ANTECUBITAL    Special Requests      BOTTLES DRAWN AEROBIC AND ANAEROBIC  Blood Culture adequate volume   Culture      NO GROWTH 2 DAYS Performed at [facility], [address], [place], [place] [address]    Report Status PENDING   Blood culture (routine x 2)     Status: None (Preliminary result)   Collection Time: 03/19/24  4:12 PM   Specimen: BLOOD  Result Value Ref Range   Specimen Description BLOOD LEFT ANTECUBITAL    Special Requests      BOTTLES DRAWN AEROBIC AND ANAEROBIC Blood Culture results may not be optimal due to an inadequate volume of blood received in culture bottles   Culture      NO GROWTH 2 DAYS Performed at [facility], [address], [place], [place] [address]    Report Status PENDING   Lactic acid, plasma     Status: None   Collection Time: 03/19/24  4:12 PM  Result Value Ref Range   Lactic Acid, Venous 1.1 0.5 - 1.9 mmol/L    Comment: Performed at [facility], [address], [place], [place] [address]  HIV Antibody (routine testing w rflx)     Status: None   Collection Time: 03/20/24  2:54 AM  Result Value Ref Range   HIV Screen 4th Generation wRfx Non Reactive Non Reactive    Comment: Performed at [facility], [address] [place], [place] [address]  Basic metabolic panel     Status: Abnormal   Collection Time: 03/20/24  2:54 AM  Result Value Ref Range  Sodium 137 135 - 145 mmol/L   Potassium 4.1 3.5 - 5.1 mmol/L   Chloride 104 98 - 111 mmol/L   CO2 24 22 - 32 mmol/L   Glucose, Bld 129 (H) 70 - 99 mg/dL    Comment: Glucose reference range applies only to samples taken after fasting for at least 8 hours.   BUN 12 6 - 20 mg/dL   Creatinine, Ser 9.56 0.61 - 1.24 mg/dL   Calcium 9.0 8.9 - 21.3 mg/dL   GFR, Estimated >08 >65 mL/min    Comment: (NOTE) Calculated using the CKD-EPI Creatinine Equation (2021)    Anion gap 9 5 - 15    Comment: Performed at [facility], [address], [place], [place] [address]  CBC     Status: Abnormal   Collection Time: 03/20/24  2:54 AM  Result Value Ref Range   WBC 15.2 (H)  4.0 - 10.5 K/uL   RBC 4.75 4.22 - 5.81 MIL/uL   Hemoglobin 13.9 13.0 - 17.0 g/dL   HCT 62.9 52.8 - 41.3 %   MCV 88.2 80.0 - 100.0 fL   MCH 29.3 26.0 - 34.0 pg   MCHC 33.2 30.0 - 36.0 g/dL   RDW 24.4 01.0 - 27.2 %   Platelets 298 150 - 400 K/uL   nRBC 0.0 0.0 - 0.2 %    Comment: Performed at [facility], [address] [place], [place] [address]  CBC with Differential/Platelet     Status: Abnormal   Collection Time: 03/21/24  8:17 AM  Result Value Ref Range   WBC 13.8 (H) 4.0 - 10.5 K/uL   RBC 4.68 4.22 - 5.81 MIL/uL   Hemoglobin 13.7 13.0 - 17.0 g/dL   HCT 40.3 47.4 - 25.9 %   MCV 88.0 80.0 - 100.0 fL   MCH 29.3 26.0 - 34.0 pg   MCHC 33.3 30.0 - 36.0 g/dL   RDW 56.3 87.5 - 64.3 %   Platelets 306 150 - 400 K/uL   nRBC 0.0 0.0 - 0.2 %   Neutrophils Relative % 69 %   Neutro Abs 9.5 (H) 1.7 - 7.7 K/uL   Lymphocytes Relative 21 %   Lymphs Abs 2.8 0.7 - 4.0 K/uL   Monocytes Relative 10 %   Monocytes Absolute 1.4 (H) 0.1 - 1.0 K/uL   Eosinophils Relative 0 %   Eosinophils Absolute 0.0 0.0 - 0.5 K/uL   Basophils Relative 0 %   Basophils Absolute 0.0 0.0 - 0.1 K/uL   Immature Granulocytes 0 %   Abs Immature Granulocytes 0.06 0.00 - 0.07 K/uL    Comment: Performed at [facility], [address] [place], [place] [address]  Basic metabolic panel     Status: Abnormal   Collection Time: 03/21/24  8:17 AM  Result Value Ref Range   Sodium 140 135 - 145 mmol/L   Potassium 4.0 3.5 - 5.1 mmol/L   Chloride 104 98 - 111 mmol/L   CO2 29 22 - 32 mmol/L   Glucose, Bld 106 (H) 70 - 99 mg/dL    Comment: Glucose reference range applies only to samples taken after fasting for at least 8 hours.   BUN 13 6 - 20 mg/dL   Creatinine, Ser 8.84 0.61 - 1.24 mg/dL   Calcium 9.3 8.9 - 16.6 mg/dL   GFR, Estimated >06 >30 mL/min    Comment: (NOTE) Calculated using the CKD-EPI Creatinine Equation (2021)    Anion gap 7 5 - 15  Comment: Performed at [facility], [address],  [place], [place] [address]    CT Soft Tissue Neck W Contrast Addendum Date: 03/19/2024 ADDENDUM REPORT: 03/19/2024 16:09 ADDENDUM: These results were called by telephone at the time of interpretation on 03/19/2024 at 3:53 pm to provider CELESTE BEATTY , who verbally acknowledged these results. Electronically Signed   By: Denny Flack M.D.   On: 03/19/2024 16:09   Result Date: 03/19/2024 CLINICAL DATA:  Concern for infection, sore throat EXAM: CT NECK WITH CONTRAST TECHNIQUE: Multidetector CT imaging of the neck was performed using the standard protocol following the bolus administration of intravenous contrast. RADIATION DOSE REDUCTION: This exam was performed according to the departmental dose-optimization program which includes automated exposure control, adjustment of the mA and/or kV according to patient size and/or use of iterative reconstruction technique. CONTRAST:  75mL OMNIPAQUE  IOHEXOL  300 MG/ML  SOLN COMPARISON:  None Available. FINDINGS: Pharynx and larynx: Significant asymmetric enlargement of the left palatine tonsil resulting in moderate to severe narrowing of the oro pharyngeal airway. There is ill-defined hypoattenuation within the inferior aspect of the left palatine tonsil without definite peripheral enhancement concerning for phlegmonous change. Edema within the left parapharyngeal fat. Edema extends along the left lateral and posterior aspects of the pharyngeal mucosa extending inferiorly to the level of the supraglottic airway. There is thickening of the epiglottis likely related to reactive edema. Edematous appearance of the left aryepiglottic fold with effacement of the left piriform sinus. There is mild narrowing of the supraglottic airway. There is also possible edema of the left vocal folds. Retropharyngeal effusion extending from the skull base to the level of C6-7 measuring up to 5 mm in thickness. Salivary glands: No inflammation, mass, or stone. Thyroid: Normal. Lymph nodes: Enlarged  bilateral level 2A cervical nodes measuring up to 1.7 cm in short axis on the left and 1.3 cm on the right. There are numerous additional prominent level 1A, 1 B, and 2 B cervical nodes. Additional mildly prominent left level 3 cervical nodes. Vascular: Negative. Limited intracranial: Negative. Visualized orbits: Partially visualized orbits are unremarkable. Mastoids and visualized paranasal sinuses: Mastoid air cells are clear. Focal mucosal thickening in the medial aspect of the left maxillary sinus. Mucous retention cyst in the left maxillary sinus. Skeleton: No acute or aggressive process. Facet arthrosis on the left at C2-3. Dental caries. Periapical lucency involving the left maxillary second premolar. Upper chest: Negative. Other: None. IMPRESSION: Significant asymmetric enlargement of the left palatine tonsil concerning for tonsillitis. Ill-defined hypoattenuation in the inferior aspect of the left palatine tonsil suggestive of phlegmonous change. No definite peripheral enhancement to suggest abscess although the contrast timing is not optimal. Recommend ENT consultation. Associated moderate to severe narrowing of the oropharyngeal airway. Significant edema along the pharyngeal mucosa extending to the supraglottic airway and hypopharynx. Thickening of the left aryepiglottic fold with mild narrowing of the supraglottic airway. Thickening of the epiglottis likely reflecting reactive edema. Retropharyngeal effusion measuring 5 mm in maximum thickness. Cervical lymphadenopathy, likely reactive. Electronically Signed: By: Denny Flack M.D. On: 03/19/2024 15:49    OZH:[id number] other than stated per HPI  Blood pressure (!) 147/92, pulse 85, temperature 98.5 F (36.9 C), temperature source Oral, resp. rate 13, height 5\' 7"  (1.702 m), weight 129.4 kg, SpO2 97%.  PHYSICAL EXAM:  CONSTITUTIONAL: well developed, nourished, no distress and alert and oriented x 3 EYES: PERRL, EOMI  HENT: Head : normocephalic  and atraumatic Ears: Right ear:   canal normal, external ear  normal and hearing normal Left ear:   canal normal, external ear normal and hearing normal Nose: nose normal and no purulence Mouth/Throat:  Mouth: Mild to moderate diffuse swelling of the lateral pharyngeal wall at the level of the oropharynx without exudates.  NECK: supple, trachea normal and no thyromegaly or cervical LAD NEURO: CN II-XII symmetric intact   Flexible bedside nasolaryngoscopy was performed.  This showed resolution of the swelling at the larynx and supraglottic level.  There was moderate swelling along the superior aspect of the lateral pharyngeal wall and tonsillar region.  Studies Reviewed: CT images  Assessment/Plan:  Acute pharyngitis - Acute onset left pharyngitis with initial laryngeal involvement.  Based on laryngoscopy the laryngeal swelling has significantly improved.  Additionally his symptoms correlate with this.  He does continue to have swelling at the the level of the oropharynx however this is not obstructing or affecting his airway or swallowing at this time.  He likely could be discharged on a tapering dose of oral steroids and antibiotics.  I would recommend follow-up with me in the office next week.  Dysphagia - This is much improved and likely due to the resolution of the swallowing at the laryngeal level.   Zach Daelin Haste MD [facility]  03/21/2024, 12:43 PM

## 2024-03-21 NOTE — Progress Notes (Signed)
 ENT cart retrieved from OR and placed in Pts room per request by ENT specialist.

## 2024-03-21 NOTE — Subjective & Objective (Signed)
 Pt seen and examined. No pain at all with swallowing. No fevers. States he feels "100% better" since 2 days ago. Ready to go home. Has important event tomorrow and does not have child care tonight.  Sent to [facility] for ENT evaluation. ENT made aware of pt's arrival to [facility].

## 2024-03-21 NOTE — Progress Notes (Signed)
 PROGRESS NOTE    Tyrone Martin  ZOX:[id number] DOB: 1986/04/22 DOA: 03/19/2024 PCP: Caresse Chant, FNP  Subjective: Pt seen and examined. No pain at all with swallowing. No fevers. States he feels "100% better" since 2 days ago. Ready to go home. Has important event tomorrow and does not have child care tonight.  Sent to [facility] for ENT evaluation. ENT made aware of pt's arrival to [facility].   Hospital Course: HPI: Tyrone Martin  is a 38 y.o. male, with past medical history of hypertension, tobacco use, asthma on albuterol , he presents to ED secondary to complaints of sore throat, hoarse voice, patient reports was seen 2 days ago in urgent care, for sore throat, had negative strep swab, was told he had Celestone, and discharged on penicillin, reports he has been having difficulty swallowing this morning, voice is extremely hoarse which prompted him to come to ED.  Ported take his losartan  this morning but blood pressure as well has been running high.  No chest pain, no shortness of breath, no nausea, no vomiting. - In the ED patient was low-grade temperature 99.4, had leukocytosis 13.4, CT head and neck showing left tonsillitis with phlegmon, pharyngitis, epiglottitis, with moderate to severe narrowing of oropharyngeal airways, ED discussed with ENT at [facility] Dr. Roslyn Coombe who recommended admission to [facility], started on IV steroids and antibiotics, [facility] hospitalist consulted to admit.  Significant Events: Admitted 03/19/2024 for acute epiglottitis   Significant Labs: WBC 13.4, HgB 14.6, plt 270 Na 137, K 3.7, CO2 of 26, BUN 9, Scr 0.93, glu 123  Significant Imaging Studies: CT soft tissue neck Significant asymmetric enlargement of the left palatine tonsil concerning for tonsillitis. Ill-defined hypoattenuation in the inferior aspect of the left palatine tonsil suggestive of phlegmonous change. No definite peripheral enhancement to suggest abscess although the contrast timing  is not optimal. Recommend ENT consultation. Associated moderate to severe narrowing of the oropharyngeal airway. Significant edema along the pharyngeal mucosa extending to the supraglottic airway and hypopharynx. Thickening of the left aryepiglottic fold with mild narrowing of the supraglottic airway.  Thickening of the epiglottis likely reflecting reactive edema.  Retropharyngeal effusion measuring 5 mm in maximum thickness. Cervical lymphadenopathy, likely reactive.  Antibiotic Therapy: Anti-infectives (From admission, onward)    Start     Dose/Rate Route Frequency Ordered Stop   03/19/24 2200  Ampicillin -Sulbactam (UNASYN ) 3 g in sodium chloride  0.9 % 100 mL IVPB        3 g 200 mL/hr over 30 Minutes Intravenous Every 6 hours 03/19/24 1720     03/19/24 1600  Ampicillin -Sulbactam (UNASYN ) 3 g in sodium chloride  0.9 % 100 mL IVPB        3 g 200 mL/hr over 30 Minutes Intravenous  Once 03/19/24 1557 03/19/24 1731       Procedures:   Consultants: ENT    Assessment and Plan: * Tonsillitis 03-21-2024 pt states he is symptomatically improved 100% since admission. On IV unasyn  and decadron . Pt states he wants to be discharged today. Awaiting ENT evaluation.  Tobacco abuse 03-21-2024 advised to quit smoking  Obesity, Class III, BMI 40-49.9 (morbid obesity) (HCC) Body mass index is 44.68 kg/m.   Asthma, chronic 03-21-2024 stable.  Essential hypertension 03-21-2024 continue cozaar .   DVT prophylaxis:   Lovenox    Code Status: Full Code Family Communication: no family at bedside. Pt is decisional. Disposition Plan: return home Reason for continuing need for hospitalization: awaiting ENT evaluation for DC clearance.  Objective: Vitals:   03/20/24  2203 03/20/24 2352 03/21/24 0331 03/21/24 0857  BP: (!) 140/89 (!) 137/98 (!) 136/96 (!) 147/92  Pulse: 92 79 60 85  Resp: 14 15 18 13   Temp:  98.2 F (36.8 C) 98 F (36.7 C) 98.5 F (36.9 C)  TempSrc:  Oral Oral Oral  SpO2:  98% 95% 96% 97%  Weight:      Height:        Intake/Output Summary (Last 24 hours) at 03/21/2024 0932 Last data filed at 03/21/2024 0437 Gross per 24 hour  Intake 1800 ml  Output --  Net 1800 ml   Filed Weights   03/19/24 1114 03/20/24 2100  Weight: 128.8 kg 129.4 kg    Examination:  Physical Exam Vitals and nursing note reviewed.  Constitutional:      General: He is not in acute distress.    Appearance: He is obese. He is not toxic-appearing or diaphoretic.  HENT:     Head: Normocephalic and atraumatic.     Nose: Nose normal.     Mouth/Throat:     Tonsils: 3+ on the left.  Eyes:     General: No scleral icterus. Cardiovascular:     Rate and Rhythm: Normal rate and regular rhythm.  Pulmonary:     Effort: Pulmonary effort is normal. No respiratory distress.     Breath sounds: Normal breath sounds. No wheezing or rales.  Abdominal:     General: Bowel sounds are normal. There is no distension.     Palpations: Abdomen is soft.     Tenderness: There is no abdominal tenderness. There is no guarding.  Musculoskeletal:     Right lower leg: No edema.     Left lower leg: No edema.  Skin:    General: Skin is warm and dry.     Capillary Refill: Capillary refill takes less than 2 seconds.  Neurological:     General: No focal deficit present.     Mental Status: He is alert and oriented to person, place, and time.     Data Reviewed: I have personally reviewed following labs and imaging studies  CBC: Recent Labs  Lab 03/19/24 1159 03/20/24 0254 03/21/24 0817  WBC 13.4* 15.2* 13.8*  NEUTROABS 10.7*  --  9.5*  HGB 14.6 13.9 13.7  HCT 44.3 41.9 41.2  MCV 88.2 88.2 88.0  PLT 270 298 306   Basic Metabolic Panel: Recent Labs  Lab 03/19/24 1159 03/20/24 0254  NA 137 137  K 3.7 4.1  CL 100 104  CO2 26 24  GLUCOSE 123* 129*  BUN 9 12  CREATININE 0.93 0.86  CALCIUM 9.6 9.0   GFR: Estimated Creatinine Clearance: 153.5 mL/min (by C-G formula based on SCr of 0.86  mg/dL). Liver Function Tests: Recent Labs  Lab 03/19/24 1159  AST 16  ALT 19  ALKPHOS 42  BILITOT 1.0  PROT 8.2*  ALBUMIN 4.3   Sepsis Labs: Recent Labs  Lab 03/19/24 1612  LATICACIDVEN 1.1    Recent Results (from the past 240 hours)  Blood culture (routine x 2)     Status: None (Preliminary result)   Collection Time: 03/19/24  4:12 PM   Specimen: BLOOD  Result Value Ref Range Status   Specimen Description BLOOD RIGHT ANTECUBITAL  Final   Special Requests   Final    BOTTLES DRAWN AEROBIC AND ANAEROBIC Blood Culture adequate volume   Culture   Final    NO GROWTH < 24 HOURS Performed at [facility], [address] [place],  [place] [address]    Report Status PENDING  Incomplete  Blood culture (routine x 2)     Status: None (Preliminary result)   Collection Time: 03/19/24  4:12 PM   Specimen: BLOOD  Result Value Ref Range Status   Specimen Description BLOOD LEFT ANTECUBITAL  Final   Special Requests   Final    BOTTLES DRAWN AEROBIC AND ANAEROBIC Blood Culture results may not be optimal due to an inadequate volume of blood received in culture bottles   Culture   Final    NO GROWTH < 24 HOURS Performed at [facility], [address] [place], [place] [address]    Report Status PENDING  Incomplete     Radiology Studies: CT Soft Tissue Neck W Contrast Addendum Date: 03/19/2024 ADDENDUM REPORT: 03/19/2024 16:09 ADDENDUM: These results were called by telephone at the time of interpretation on 03/19/2024 at 3:53 pm to provider CELESTE BEATTY , who verbally acknowledged these results. Electronically Signed   By: Denny Flack M.D.   On: 03/19/2024 16:09   Result Date: 03/19/2024 CLINICAL DATA:  Concern for infection, sore throat EXAM: CT NECK WITH CONTRAST TECHNIQUE: Multidetector CT imaging of the neck was performed using the standard protocol following the bolus administration of intravenous contrast. RADIATION DOSE REDUCTION: This exam was performed according to the  departmental dose-optimization program which includes automated exposure control, adjustment of the mA and/or kV according to patient size and/or use of iterative reconstruction technique. CONTRAST:  75mL OMNIPAQUE  IOHEXOL  300 MG/ML  SOLN COMPARISON:  None Available. FINDINGS: Pharynx and larynx: Significant asymmetric enlargement of the left palatine tonsil resulting in moderate to severe narrowing of the oro pharyngeal airway. There is ill-defined hypoattenuation within the inferior aspect of the left palatine tonsil without definite peripheral enhancement concerning for phlegmonous change. Edema within the left parapharyngeal fat. Edema extends along the left lateral and posterior aspects of the pharyngeal mucosa extending inferiorly to the level of the supraglottic airway. There is thickening of the epiglottis likely related to reactive edema. Edematous appearance of the left aryepiglottic fold with effacement of the left piriform sinus. There is mild narrowing of the supraglottic airway. There is also possible edema of the left vocal folds. Retropharyngeal effusion extending from the skull base to the level of C6-7 measuring up to 5 mm in thickness. Salivary glands: No inflammation, mass, or stone. Thyroid: Normal. Lymph nodes: Enlarged bilateral level 2A cervical nodes measuring up to 1.7 cm in short axis on the left and 1.3 cm on the right. There are numerous additional prominent level 1A, 1 B, and 2 B cervical nodes. Additional mildly prominent left level 3 cervical nodes. Vascular: Negative. Limited intracranial: Negative. Visualized orbits: Partially visualized orbits are unremarkable. Mastoids and visualized paranasal sinuses: Mastoid air cells are clear. Focal mucosal thickening in the medial aspect of the left maxillary sinus. Mucous retention cyst in the left maxillary sinus. Skeleton: No acute or aggressive process. Facet arthrosis on the left at C2-3. Dental caries. Periapical lucency involving the  left maxillary second premolar. Upper chest: Negative. Other: None. IMPRESSION: Significant asymmetric enlargement of the left palatine tonsil concerning for tonsillitis. Ill-defined hypoattenuation in the inferior aspect of the left palatine tonsil suggestive of phlegmonous change. No definite peripheral enhancement to suggest abscess although the contrast timing is not optimal. Recommend ENT consultation. Associated moderate to severe narrowing of the oropharyngeal airway. Significant edema along the pharyngeal mucosa extending to the supraglottic airway and hypopharynx. Thickening of the left aryepiglottic fold with mild narrowing of  the supraglottic airway. Thickening of the epiglottis likely reflecting reactive edema. Retropharyngeal effusion measuring 5 mm in maximum thickness. Cervical lymphadenopathy, likely reactive. Electronically Signed: By: Denny Flack M.D. On: 03/19/2024 15:49    Scheduled Meds:  dexamethasone  (DECADRON ) injection  10 mg Intravenous Q24H   enoxaparin  (LOVENOX ) injection  0.5 mg/kg Subcutaneous Q24H   losartan   50 mg Oral Daily   pantoprazole  (PROTONIX ) IV  40 mg Intravenous Q24H   Continuous Infusions:  ampicillin -sulbactam (UNASYN ) IV Stopped (03/21/24 0434)     LOS: 2 days   Time spent: 45 minutes  Unk Garb, DO  [facility]  03/21/2024, 9:32 AM

## 2024-03-21 NOTE — Assessment & Plan Note (Signed)
 03-21-2024 stable.

## 2024-03-24 LAB — CULTURE, BLOOD (ROUTINE X 2)
Culture: NO GROWTH
Culture: NO GROWTH
Special Requests: ADEQUATE
